# Patient Record
Sex: Female | Born: 1935 | Race: White | Hispanic: No | Smoking: Never smoker
Health system: Southern US, Community
[De-identification: ages and names within clinical notes are randomized; demographics above are authoritative.]

## PROBLEM LIST (undated history)

## (undated) DIAGNOSIS — I119 Hypertensive heart disease without heart failure: Secondary | ICD-10-CM

## (undated) DIAGNOSIS — C189 Malignant neoplasm of colon, unspecified: Secondary | ICD-10-CM

## (undated) DIAGNOSIS — I480 Paroxysmal atrial fibrillation: Secondary | ICD-10-CM

## (undated) DIAGNOSIS — I4892 Unspecified atrial flutter: Secondary | ICD-10-CM

## (undated) DIAGNOSIS — I2510 Atherosclerotic heart disease of native coronary artery without angina pectoris: Secondary | ICD-10-CM

## (undated) DIAGNOSIS — R001 Bradycardia, unspecified: Secondary | ICD-10-CM

## (undated) DIAGNOSIS — E119 Type 2 diabetes mellitus without complications: Secondary | ICD-10-CM

## (undated) DIAGNOSIS — I5032 Chronic diastolic (congestive) heart failure: Secondary | ICD-10-CM

## (undated) DIAGNOSIS — Z8719 Personal history of other diseases of the digestive system: Secondary | ICD-10-CM

## (undated) HISTORY — DX: Unspecified atrial flutter: I48.92

## (undated) HISTORY — PX: TONSILLECTOMY: SUR1361

## (undated) HISTORY — PX: CORONARY ANGIOPLASTY WITH STENT PLACEMENT: SHX49

## (undated) HISTORY — DX: Personal history of other diseases of the digestive system: Z87.19

## (undated) HISTORY — PX: ABDOMINAL HYSTERECTOMY: SHX81

## (undated) HISTORY — DX: Hypertensive heart disease without heart failure: I11.9

---

## 2000-10-23 ENCOUNTER — Emergency Department (HOSPITAL_COMMUNITY): Admission: EM | Admit: 2000-10-23 | Discharge: 2000-10-23 | Payer: Self-pay | Admitting: Emergency Medicine

## 2004-12-07 ENCOUNTER — Inpatient Hospital Stay (HOSPITAL_COMMUNITY): Admission: RE | Admit: 2004-12-07 | Discharge: 2004-12-13 | Payer: Self-pay | Admitting: Gastroenterology

## 2004-12-07 ENCOUNTER — Ambulatory Visit: Payer: Self-pay | Admitting: Oncology

## 2004-12-19 ENCOUNTER — Inpatient Hospital Stay (HOSPITAL_COMMUNITY): Admission: EM | Admit: 2004-12-19 | Discharge: 2004-12-22 | Payer: Self-pay | Admitting: Emergency Medicine

## 2011-06-28 ENCOUNTER — Other Ambulatory Visit: Payer: Self-pay | Admitting: Dermatology

## 2014-01-13 ENCOUNTER — Emergency Department (HOSPITAL_COMMUNITY): Payer: Medicare Other

## 2014-01-13 ENCOUNTER — Encounter (HOSPITAL_COMMUNITY): Payer: Self-pay | Admitting: Family Medicine

## 2014-01-13 ENCOUNTER — Inpatient Hospital Stay (HOSPITAL_COMMUNITY)
Admission: EM | Admit: 2014-01-13 | Discharge: 2014-01-16 | DRG: 246 | Disposition: A | Discharge status: Home / Self Care | Payer: Medicare Other | Attending: Cardiovascular Disease | Admitting: Cardiovascular Disease

## 2014-01-13 DIAGNOSIS — I214 Non-ST elevation (NSTEMI) myocardial infarction: Secondary | ICD-10-CM | POA: Diagnosis not present

## 2014-01-13 DIAGNOSIS — Z85038 Personal history of other malignant neoplasm of large intestine: Secondary | ICD-10-CM

## 2014-01-13 DIAGNOSIS — E876 Hypokalemia: Secondary | ICD-10-CM | POA: Diagnosis not present

## 2014-01-13 DIAGNOSIS — I5043 Acute on chronic combined systolic (congestive) and diastolic (congestive) heart failure: Secondary | ICD-10-CM | POA: Diagnosis present

## 2014-01-13 DIAGNOSIS — Z886 Allergy status to analgesic agent status: Secondary | ICD-10-CM

## 2014-01-13 DIAGNOSIS — Z8679 Personal history of other diseases of the circulatory system: Secondary | ICD-10-CM

## 2014-01-13 DIAGNOSIS — I472 Ventricular tachycardia: Secondary | ICD-10-CM | POA: Diagnosis present

## 2014-01-13 DIAGNOSIS — R1010 Upper abdominal pain, unspecified: Secondary | ICD-10-CM

## 2014-01-13 DIAGNOSIS — I10 Essential (primary) hypertension: Secondary | ICD-10-CM | POA: Diagnosis present

## 2014-01-13 DIAGNOSIS — I480 Paroxysmal atrial fibrillation: Secondary | ICD-10-CM

## 2014-01-13 DIAGNOSIS — R001 Bradycardia, unspecified: Secondary | ICD-10-CM | POA: Diagnosis present

## 2014-01-13 DIAGNOSIS — J90 Pleural effusion, not elsewhere classified: Secondary | ICD-10-CM

## 2014-01-13 DIAGNOSIS — Z955 Presence of coronary angioplasty implant and graft: Secondary | ICD-10-CM

## 2014-01-13 DIAGNOSIS — E119 Type 2 diabetes mellitus without complications: Secondary | ICD-10-CM

## 2014-01-13 HISTORY — DX: Type 2 diabetes mellitus without complications: E11.9

## 2014-01-13 LAB — PROTIME-INR
INR: 1.16 (ref 0.00–1.49)
Prothrombin Time: 14.9 seconds (ref 11.6–15.2)

## 2014-01-13 LAB — CBC WITH DIFFERENTIAL/PLATELET
Basophils Absolute: 0 10*3/uL (ref 0.0–0.1)
Basophils Relative: 0 % (ref 0–1)
EOS ABS: 0 10*3/uL (ref 0.0–0.7)
Eosinophils Relative: 0 % (ref 0–5)
HCT: 41.9 % (ref 36.0–46.0)
HEMOGLOBIN: 14.1 g/dL (ref 12.0–15.0)
LYMPHS ABS: 1.1 10*3/uL (ref 0.7–4.0)
Lymphocytes Relative: 11 % — ABNORMAL LOW (ref 12–46)
MCH: 29.4 pg (ref 26.0–34.0)
MCHC: 33.7 g/dL (ref 30.0–36.0)
MCV: 87.3 fL (ref 78.0–100.0)
MONOS PCT: 6 % (ref 3–12)
Monocytes Absolute: 0.6 10*3/uL (ref 0.1–1.0)
NEUTROS PCT: 83 % — AB (ref 43–77)
Neutro Abs: 7.9 10*3/uL — ABNORMAL HIGH (ref 1.7–7.7)
PLATELETS: 216 10*3/uL (ref 150–400)
RBC: 4.8 MIL/uL (ref 3.87–5.11)
RDW: 13.4 % (ref 11.5–15.5)
WBC: 9.6 10*3/uL (ref 4.0–10.5)

## 2014-01-13 LAB — COMPREHENSIVE METABOLIC PANEL
ALK PHOS: 78 U/L (ref 39–117)
ALT: 19 U/L (ref 0–35)
ANION GAP: 16 — AB (ref 5–15)
AST: 43 U/L — ABNORMAL HIGH (ref 0–37)
Albumin: 3.9 g/dL (ref 3.5–5.2)
BILIRUBIN TOTAL: 1.1 mg/dL (ref 0.3–1.2)
BUN: 13 mg/dL (ref 6–23)
CO2: 24 meq/L (ref 19–32)
Calcium: 9.2 mg/dL (ref 8.4–10.5)
Chloride: 98 mEq/L (ref 96–112)
Creatinine, Ser: 0.65 mg/dL (ref 0.50–1.10)
GFR, EST AFRICAN AMERICAN: 89 mL/min — AB (ref >90)
GFR, EST NON AFRICAN AMERICAN: 77 mL/min — AB (ref >90)
Glucose, Bld: 233 mg/dL — ABNORMAL HIGH (ref 70–99)
POTASSIUM: 4 meq/L (ref 3.7–5.3)
SODIUM: 138 meq/L (ref 137–147)
Total Protein: 7.8 g/dL (ref 6.0–8.3)

## 2014-01-13 LAB — I-STAT TROPONIN, ED: Troponin i, poc: 4.4 ng/mL (ref 0.00–0.08)

## 2014-01-13 LAB — TROPONIN I
TROPONIN I: 6.2 ng/mL — AB (ref <0.30)
Troponin I: >20 ng/mL (ref <0.30)

## 2014-01-13 LAB — GLUCOSE, CAPILLARY
GLUCOSE-CAPILLARY: 194 mg/dL — AB (ref 70–99)
Glucose-Capillary: 210 mg/dL — ABNORMAL HIGH (ref 70–99)

## 2014-01-13 LAB — TSH: TSH: 4.19 u[IU]/mL (ref 0.350–4.500)

## 2014-01-13 LAB — HEPARIN LEVEL (UNFRACTIONATED): Heparin Unfractionated: 0.46 IU/mL (ref 0.30–0.70)

## 2014-01-13 LAB — PRO B NATRIURETIC PEPTIDE: PRO B NATRI PEPTIDE: 2416 pg/mL — AB (ref 0–450)

## 2014-01-13 LAB — APTT: aPTT: 129 seconds — ABNORMAL HIGH (ref 24–37)

## 2014-01-13 LAB — LIPASE, BLOOD: Lipase: 15 U/L (ref 11–59)

## 2014-01-13 MED ORDER — ASPIRIN EC 81 MG PO TBEC: 81.0000 mg | DELAYED_RELEASE_TABLET | Freq: Every day | ORAL | Status: DC

## 2014-01-13 MED ORDER — CLOPIDOGREL BISULFATE 75 MG PO TABS
75.0000 mg | ORAL_TABLET | Freq: Every day | ORAL | Status: DC
  Administered 2014-01-14 – 2014-01-16 (×3): 75 mg via ORAL
  Filled 2014-01-13 (×5): qty 1

## 2014-01-13 MED ORDER — HEPARIN BOLUS VIA INFUSION
4000.0000 [IU] | Freq: Once | INTRAVENOUS | Status: AC
  Administered 2014-01-13: 4000 [IU] via INTRAVENOUS
  Filled 2014-01-13: qty 4000

## 2014-01-13 MED ORDER — IRBESARTAN 300 MG PO TABS
300.0000 mg | ORAL_TABLET | Freq: Every day | ORAL | Status: DC
  Administered 2014-01-13 – 2014-01-16 (×4): 300 mg via ORAL
  Filled 2014-01-13 (×4): qty 1

## 2014-01-13 MED ORDER — SODIUM CHLORIDE 0.9 % IJ SOLN: 3.0000 mL | INTRAMUSCULAR | Status: DC | PRN

## 2014-01-13 MED ORDER — ACETAMINOPHEN 325 MG PO TABS: 650.0000 mg | ORAL_TABLET | ORAL | Status: DC | PRN

## 2014-01-13 MED ORDER — SODIUM CHLORIDE 0.9 % IJ SOLN
3.0000 mL | Freq: Two times a day (BID) | INTRAMUSCULAR | Status: DC
  Administered 2014-01-13: 3 mL via INTRAVENOUS

## 2014-01-13 MED ORDER — SODIUM CHLORIDE 0.9 % IJ SOLN
3.0000 mL | Freq: Two times a day (BID) | INTRAMUSCULAR | Status: DC
  Administered 2014-01-14 – 2014-01-15 (×3): 3 mL via INTRAVENOUS

## 2014-01-13 MED ORDER — SODIUM CHLORIDE 0.9 % IV SOLN: 250.0000 mL | INTRAVENOUS | Status: DC | PRN

## 2014-01-13 MED ORDER — DILTIAZEM HCL ER COATED BEADS 180 MG PO CP24
180.0000 mg | ORAL_CAPSULE | Freq: Every day | ORAL | Status: DC
  Administered 2014-01-13 – 2014-01-16 (×4): 180 mg via ORAL
  Filled 2014-01-13 (×4): qty 1

## 2014-01-13 MED ORDER — ATORVASTATIN CALCIUM 80 MG PO TABS
80.0000 mg | ORAL_TABLET | Freq: Every day | ORAL | Status: DC
  Administered 2014-01-14 – 2014-01-15 (×2): 80 mg via ORAL
  Filled 2014-01-13 (×5): qty 1

## 2014-01-13 MED ORDER — HEPARIN (PORCINE) IN NACL 100-0.45 UNIT/ML-% IJ SOLN
800.0000 [IU]/h | INTRAMUSCULAR | Status: DC
  Administered 2014-01-13: 800 [IU]/h via INTRAVENOUS
  Filled 2014-01-13 (×2): qty 250

## 2014-01-13 MED ORDER — ONDANSETRON HCL 4 MG/2ML IJ SOLN: 4.0000 mg | Freq: Four times a day (QID) | INTRAMUSCULAR | Status: DC | PRN

## 2014-01-13 MED ORDER — CITALOPRAM HYDROBROMIDE 20 MG PO TABS
20.0000 mg | ORAL_TABLET | Freq: Every day | ORAL | Status: DC
  Administered 2014-01-13 – 2014-01-16 (×4): 20 mg via ORAL
  Filled 2014-01-13 (×4): qty 1

## 2014-01-13 MED ORDER — METOPROLOL TARTRATE 12.5 MG HALF TABLET
12.5000 mg | ORAL_TABLET | Freq: Two times a day (BID) | ORAL | Status: DC
  Administered 2014-01-13 – 2014-01-16 (×5): 12.5 mg via ORAL
  Filled 2014-01-13 (×8): qty 1

## 2014-01-13 MED ORDER — INSULIN ASPART 100 UNIT/ML ~~LOC~~ SOLN
0.0000 [IU] | Freq: Three times a day (TID) | SUBCUTANEOUS | Status: DC
  Administered 2014-01-13: 3 [IU] via SUBCUTANEOUS
  Administered 2014-01-15: 08:00:00 2 [IU] via SUBCUTANEOUS
  Administered 2014-01-15: 12:00:00 5 [IU] via SUBCUTANEOUS
  Administered 2014-01-15 – 2014-01-16 (×2): 3 [IU] via SUBCUTANEOUS

## 2014-01-13 MED ORDER — NITROGLYCERIN 0.4 MG SL SUBL: 0.4000 mg | SUBLINGUAL_TABLET | SUBLINGUAL | Status: DC | PRN

## 2014-01-13 MED ORDER — ASPIRIN 81 MG PO CHEW: 324.0000 mg | CHEWABLE_TABLET | Freq: Once | ORAL | Status: DC

## 2014-01-13 MED ORDER — SODIUM CHLORIDE 0.9 % IV SOLN
INTRAVENOUS | Status: DC
  Administered 2014-01-13: 75 mL/h via INTRAVENOUS
  Administered 2014-01-14 (×2): via INTRAVENOUS

## 2014-01-13 NOTE — ED Notes (Signed)
Per pt and family having upper abdominal pain , weakness and nausea. sts also some neck pain.

## 2014-01-13 NOTE — ED Notes (Signed)
NOTIFIED DR. Venora Maples IN PERSON FOR PATIENTS PANIC LAB RESULTS OF I-STAT TROPONIN = 4.40 ng/ml @11 :05 AM ,01/13/2014.

## 2014-01-13 NOTE — ED Provider Notes (Signed)
CSN: [id number]     Arrival date & time 01/13/14  8341 History   First MD Initiated Contact with Patient 01/13/14 1118     Chief Complaint  Patient presents with  . Abdominal Pain    Patient is a 80 y.o. female presenting with abdominal pain. The history is provided by the patient.  Abdominal Pain Pain location:  Epigastric Pain quality comment:  "discomfort" Pain radiates to:  LUQ and RUQ Pain severity:  Mild Onset quality:  Gradual Duration:  2 days Timing:  Intermittent Progression:  Waxing and waning Chronicity:  New Context: not diet changes, not sick contacts and not suspicious food intake   Relieved by:  None tried Worsened by:  Nothing tried Ineffective treatments:  None tried Associated symptoms: nausea, shortness of breath and vomiting   Associated symptoms: no cough and no fever   Nausea:    Severity:  Severe   Onset quality:  Gradual   Duration:  1 day   Timing:  Constant   Progression:  Improving Shortness of breath:    Severity:  Mild   Onset quality:  Gradual   Progression:  Unchanged Vomiting:    Quality:  Stomach contents   Progression:  Partially resolved Risk factors: being elderly     Past Medical History  Diagnosis Date  . Diabetes mellitus without complication   . Hypertension   . Atrial fibrillation    History reviewed. No pertinent past surgical history. History reviewed. No pertinent family history. History  Substance Use Topics  . Smoking status: Never Smoker   . Smokeless tobacco: Not on file  . Alcohol Use: No   OB History    No data available     Review of Systems  Constitutional: Negative for fever.  Respiratory: Positive for shortness of breath. Negative for cough.   Gastrointestinal: Positive for nausea, vomiting and abdominal pain.  Skin: Negative for rash.  Neurological: Negative for syncope and light-headedness.  All other systems reviewed and are negative.   Allergies  Review of patient's allergies indicates not  on file.  Home Medications   Prior to Admission medications   Not on File   BP 159/77 mmHg  Pulse 102  Temp(Src) 97.9 F (36.6 C) (Oral)  Resp 18  SpO2 91%   Physical Exam  Constitutional: She is oriented to person, place, and time. She appears well-developed and well-nourished. She is cooperative. No distress.  HENT:  Head: Normocephalic and atraumatic.  Right Ear: External ear normal.  Left Ear: External ear normal.  Neck: Normal range of motion and phonation normal.  Cardiovascular: Normal rate and regular rhythm.   Pulmonary/Chest: Effort normal. No respiratory distress. She has no wheezes. She has rales (bilateral bases).  Abdominal: Soft. She exhibits no distension. There is no tenderness. There is no rebound and no guarding.  Neurological: She is alert and oriented to person, place, and time.  Skin: Skin is warm and dry. No rash noted. She is not diaphoretic.  Vitals reviewed.   ED Course  Procedures   Labs Review  Results for orders placed or performed during the hospital encounter of 01/13/14  CBC with Differential  Result Value Ref Range   WBC 9.6 4.0 - 10.5 K/uL   RBC 4.80 3.87 - 5.11 MIL/uL   Hemoglobin 14.1 12.0 - 15.0 g/dL   HCT 41.9 36.0 - 46.0 %   MCV 87.3 78.0 - 100.0 fL   MCH 29.4 26.0 - 34.0 pg   MCHC 33.7 30.0 - 36.0  g/dL   RDW 13.4 11.5 - 15.5 %   Platelets 216 150 - 400 K/uL   Neutrophils Relative % 83 (H) 43 - 77 %   Neutro Abs 7.9 (H) 1.7 - 7.7 K/uL   Lymphocytes Relative 11 (L) 12 - 46 %   Lymphs Abs 1.1 0.7 - 4.0 K/uL   Monocytes Relative 6 3 - 12 %   Monocytes Absolute 0.6 0.1 - 1.0 K/uL   Eosinophils Relative 0 0 - 5 %   Eosinophils Absolute 0.0 0.0 - 0.7 K/uL   Basophils Relative 0 0 - 1 %   Basophils Absolute 0.0 0.0 - 0.1 K/uL  Comprehensive metabolic panel  Result Value Ref Range   Sodium 138 137 - 147 mEq/L   Potassium 4.0 3.7 - 5.3 mEq/L   Chloride 98 96 - 112 mEq/L   CO2 24 19 - 32 mEq/L   Glucose, Bld 233 (H) 70 - 99  mg/dL   BUN 13 6 - 23 mg/dL   Creatinine, Ser 0.65 0.50 - 1.10 mg/dL   Calcium 9.2 8.4 - 10.5 mg/dL   Total Protein 7.8 6.0 - 8.3 g/dL   Albumin 3.9 3.5 - 5.2 g/dL   AST 43 (H) 0 - 37 U/L   ALT 19 0 - 35 U/L   Alkaline Phosphatase 78 39 - 117 U/L   Total Bilirubin 1.1 0.3 - 1.2 mg/dL   GFR calc non Af Amer 77 (L) >90 mL/min   GFR calc Af Amer 89 (L) >90 mL/min   Anion gap 16 (H) 5 - 15  Lipase, blood  Result Value Ref Range   Lipase 15 11 - 59 U/L  Troponin I  Result Value Ref Range   Troponin I 6.20 (HH) <0.30 ng/mL  Pro b natriuretic peptide  Result Value Ref Range   Pro B Natriuretic peptide (BNP) 2416.0 (H) 0 - 450 pg/mL  I-stat troponin, ED (only if pt is 78 y.o. or older & pain is above umbilicus) - do not order at [facility]  Result Value Ref Range   Troponin i, poc 4.40 (HH) 0.00 - 0.08 ng/mL   Comment NOTIFIED PHYSICIAN    Comment 3           Imaging Review Dg Chest Portable 1 View  01/13/2014   CLINICAL DATA:  Epigastric pain and nausea  EXAM: PORTABLE CHEST - 1 VIEW  COMPARISON:  None  FINDINGS: Moderate cardiomegaly. Vascular congestion. Bilateral pleural effusions and bibasilar airspace edema. No pneumothorax.  IMPRESSION: CHF with bilateral effusions and pulmonary edema.   Electronically Signed   By: Maryclare Bean M.D.   On: 01/13/2014 12:21   EKG Rate 96, Sinus rhythm, incomplete RBBB, PR 178, QTc 487; No ST elevation or depression; No priors for comparison  MDM   Final diagnoses:  Upper abdominal pain  NSTEMI (non-ST elevated myocardial infarction)    80 y.o. female presents for upper abdominal "discomfort" and nausea since yesterday. Abdomen completely benign. iSTAT troponin elevated to 4.4. EKG showed no STEMI. Rales in the bases, concern for CHF.   Concern for NSTEMI.   She is allergic to aspirin, so a heparin drip was ordered.   No CP, SOB, lightheadedness.   Repeat troponin 6.2. BNP elevated.   Cardiology consulted. They saw and evaluated her. She was  admitted to cardiology.   This case managed in conjunction with my attending, Dr. Reather Converse.    Berenice Primas, MD 01/13/14 [phone]

## 2014-01-13 NOTE — Progress Notes (Signed)
ANTICOAGULATION CONSULT NOTE - Initial Consult  Pharmacy Consult for heparin Indication: chest pain/ACS  Allergies  Allergen Reactions  . Coumadin [Warfarin]     Extreme bleeding  . Shellfish Allergy     Very alert.  Unable to sleep  . Versed [Midazolam] Nausea And Vomiting  . Aspirin Rash  . Penicillins Rash  . Sulfa Antibiotics Rash    Patient Measurements: Height: 5\' 4"  (162.6 cm) Weight: 154 lb (69.854 kg) IBW/kg (Calculated) : 54.7 Heparin Dosing Weight: 68.9kg  Vital Signs: Temp: 97.9 F (36.6 C) (11/01 0952) Temp Source: Oral (11/01 0952) BP: 157/89 mmHg (11/01 1230) Pulse Rate: 99 (11/01 1230)  Labs:  Recent Labs  01/13/14 1030 01/13/14 1119  HGB 14.1  --   HCT 41.9  --   PLT 216  --   CREATININE 0.65  --   TROPONINI  --  6.20*    Estimated Creatinine Clearance: 46.7 mL/min (by C-G formula based on Cr of 0.65).   Medical History: Past Medical History  Diagnosis Date  . Diabetes mellitus without complication   . Hypertension   . Atrial fibrillation     Medications:  Infusions:  . [START ON 01/14/2014] sodium chloride    . heparin    . heparin      Assessment: 89 yof presented to the ED with epigastric pain and nausea since last night. Noted to have a significantly elevated troponin. To start heparin for anticoagulation. Baseline CBC is WNL and she is not on any anticoagulation PTA.   Goal of Therapy:  Heparin level 0.3-0.7 units/ml Monitor platelets by anticoagulation protocol: Yes   Plan:  1. Heparin bolus 4000 units IV x 1 2. Heparin gtt 800 units/hr 3. Check an 8 hour HL 4. Daily HL and CBC  Lilah Mijangos, Rande Lawman 01/13/2014,1:30 PM

## 2014-01-13 NOTE — ED Notes (Signed)
Pt states started having RUQ discomfort last nite, that may have radiated to her back.  No sob or chest pain and denies any chest discomfort or sob at this time.  Only history with her heart is afib

## 2014-01-13 NOTE — Progress Notes (Signed)
[place] for heparin Indication: chest pain/ACS  Allergies  Allergen Reactions  . Coumadin [Warfarin]     Extreme bleeding  . Shellfish Allergy     Very alert.  Unable to sleep  . Versed [Midazolam] Nausea And Vomiting  . Aspirin Rash  . Penicillins Rash  . Sulfa Antibiotics Rash    Patient Measurements: Height: 5\' 4"  (162.6 cm) Weight: 152 lb 6.4 oz (69.128 kg) IBW/kg (Calculated) : 54.7 Heparin Dosing Weight: 68.9kg  Vital Signs: Temp: 98 F (36.7 C) (11/01 2017) Temp Source: Oral (11/01 2017) BP: 129/59 mmHg (11/01 2017) Pulse Rate: 74 (11/01 2017)  Labs:  Recent Labs  01/13/14 1030 01/13/14 1119 01/13/14 1815 01/13/14 2200  HGB 14.1  --   --   --   HCT 41.9  --   --   --   PLT 216  --   --   --   APTT  --   --  129*  --   LABPROT  --   --  14.9  --   INR  --   --  1.16  --   HEPARINUNFRC  --   --   --  0.46  CREATININE 0.65  --   --   --   TROPONINI  --  6.20* >20.00*  --     Estimated Creatinine Clearance: 46.4 mL/min (by C-G formula based on Cr of 0.65).  Assessment: 80 yo female with NSTEMI for heparin  Goal of Therapy:  Heparin level 0.3-0.7 units/ml Monitor platelets by anticoagulation protocol: Yes   Plan:  Continue Heparin at current rate Follow-up am labs.  Demira Gwynne, Bronson Curb 01/13/2014,10:55 PM

## 2014-01-13 NOTE — ED Notes (Signed)
Cardiology at the bedside.

## 2014-01-13 NOTE — Plan of Care (Signed)
Problem: Phase I Progression Outcomes Goal: Procedure Consent complete Outcome: Completed/Met Date Met:  01/13/14 Goal: CV Lab checklist complete Outcome: Completed/Met Date Met:  01/13/14 Goal: Distal pulses assessed/charted Outcome: Completed/Met Date Met:  01/13/14 Goal: Pain controlled with appropriate interventions Outcome: Completed/Met Date Met:  01/13/14 Goal: OOB as tolerated unless otherwise ordered Outcome: Completed/Met Date Met:  01/13/14

## 2014-01-13 NOTE — Plan of Care (Signed)
Problem: Consults Goal: Skin Care Protocol Initiated - if Braden Score 18 or less If consults are not indicated, leave blank or document N/A Outcome: Not Applicable Date Met:  35/78/97 Goal: Tobacco Cessation referral if indicated Outcome: Not Applicable Date Met:  84/78/41 Goal: Nutrition Consult-if indicated Outcome: Not Applicable Date Met:  28/20/81 Goal: Diabetes Guidelines if Diabetic/Glucose > 140 If diabetic or lab glucose is > 140 mg/dl - Initiate Diabetes/Hyperglycemia Guidelines & Document Interventions  Outcome: Completed/Met Date Met:  01/13/14

## 2014-01-13 NOTE — H&P (Signed)
Patient ID: Anita Roberts MRN: [id number], DOB/AGE: 78-Sep-1925   Admit date: 01/13/2014   Primary Physician: No primary care provider on file. Primary Cardiologist: New  Pt. Profile:  Anita Roberts is a 80 y.o. female with a history of HTN, colon cancer, post op atrial fib s/p colon polyp removal and DM who presented to [facility] this morning complaining of epigastric pain and nausea since last night. She has no past cardiac history but her troponin was noted to be elevated at 4.4 and cardiology was consulted.  She was in her usual state of health until last night when she started to feel slightly nauseated. This morning it became worse prompting her to present to the ED. It comes and goes and is described as an epigastric discomfort. No CP or SOB. No lightheadedness or dizziness or palpitations. No orthopnea, PND or LE edema. She is pretty active at home and is able to carry on with her daily activities including light house work and grocery shopping without any CP or SOB. She does have associated nausea but no vomiting. Not related to eating and has not eaten anything this morning. She has no family history of heart disease. She is a never smoker    Problem List  Past Medical History  Diagnosis Date  . Diabetes mellitus without complication   . Hypertension   . Atrial fibrillation     History reviewed. No pertinent past surgical history.   Allergies  Allergies not on file   Home Medications  Prior to Admission medications   Not on File    Family History  History reviewed. No pertinent family history. No family status information on file.     Social History  History   Social History  . Marital Status: Widowed    Spouse Name: N/A    Number of Children: N/A  . Years of Education: N/A   Occupational History  . Not on file.   Social History Main Topics  . Smoking status: Never Smoker   . Smokeless tobacco: Not on file  . Alcohol Use: No  . Drug Use: Not on file   . Sexual Activity: Not on file   Other Topics Concern  . Not on file   Social History Narrative  . No narrative on file     All other systems reviewed and are otherwise negative except as noted above.  Physical Exam  Blood pressure 160/81, pulse 102, temperature 97.9 F (36.6 C), temperature source Oral, resp. rate 16, SpO2 98 %.  General: Pleasant, NAD. Elderly  Psych: Normal affect. Neuro: Alert and oriented X 3. Moves all extremities spontaneously. HEENT: Normal  Neck: Supple without bruits or JVD. Lungs:  Resp regular and unlabored, CTA. Heart: tachycardic no s3, s4, or murmurs. Abdomen: Soft, non-tender, non-distended, BS + x 4.  Extremities: No clubbing, cyanosis or edema. DP/PT/Radials 2+ and equal bilaterally.  Labs  No results for input(s): CKTOTAL, CKMB, TROPONINI in the last 72 hours. Lab Results  Component Value Date   WBC 9.6 01/13/2014   HGB 14.1 01/13/2014   HCT 41.9 01/13/2014   MCV 87.3 01/13/2014   PLT 216 01/13/2014    Recent Labs Lab 01/13/14 1030  NA 138  K 4.0  CL 98  CO2 24  BUN 13  CREATININE 0.65  CALCIUM 9.2  PROT 7.8  BILITOT 1.1  ALKPHOS 78  ALT 19  AST 43*  GLUCOSE 233*    Radiology/Studies  No results found.  ECG  None in system  ASSESSMENT AND PLAN  Anita Roberts is a 80 y.o. female with a history of HTN, colon cancer, post op atrial fib s/p colon polyp removal and DM who presented to [facility] this morning complaining of epigastric pain and nausea since last night. She has no past cardiac history but her troponin was noted to be elevated at 4.4 and cardiology was consulted.  Positive troponin- She has no CP or SOB; however, worrisome for CAD in the setting of an elderly woman with DM.  -- Troponin 4.4--> 6.2. -- BNP slightly elevated 2.4. Will order an ECHO to further evaluate LV function and WMAs. -- Continue heparin gtt. -- Allergy to ASA so will place on plavix, lopressor 12.5 mg BID and statin  -- Place on cath  board tomorrow. NPO after midnight  PAF- occurred once post operatively after colon cancer removal about 8 years ago.  -- This has been quiescent on diltiazem  -- Was on Coumadin for a short time but did have some bleeding and this was discontinued  -- Tele today with some irregular rhythm: sinus with freq PACs vs afib vs wandering atrial pacemaker.   HTN- continue home Benicar 40, diltiazem CD 180mg  and will add BB in the setting of possible CAD  DM- SSI   Signed, Eileen Stanford, PA-C 01/13/2014, 12:12 PM  Pager [phone]  I have seen and examined the patient along with THOMPSON, KATHRYN R, PA-C.  I have reviewed the chart, notes and new data.  I agree with PA's note.  Key new complaints: still with mild discomfort Key examination changes: ECG without major ST deviation Key new findings / data: repeat troponin 6.4  PLAN: Acute NSTEMI with delayed presentation (probably happened last night) with mild residual angina, but no acute HF. Frequent PACs, no ventricular arrhythmia. History of PAF remotely (2006, during surgical illness). Initially she wanted a more conservative approach, but after discussion with her daughter she woul;d prefer proceeding directly to coronary angio and possible PCI. I discussed the risks and benfits of these procedures in detail with her and she understands them. ASA allergic (rash) - clopidogrel, heparin, beta blockers At risk for recurrent PAF.  Sanda Klein, MD, [place] [phone] 01/13/2014, 12:52 PM

## 2014-01-13 NOTE — Progress Notes (Signed)
I received report from [facility].

## 2014-01-14 ENCOUNTER — Other Ambulatory Visit: Payer: Self-pay

## 2014-01-14 ENCOUNTER — Encounter (HOSPITAL_COMMUNITY)
Admission: EM | Disposition: A | Discharge status: Home / Self Care | Payer: Medicare Other | Source: Home / Self Care | Attending: Cardiovascular Disease

## 2014-01-14 DIAGNOSIS — E876 Hypokalemia: Secondary | ICD-10-CM | POA: Diagnosis not present

## 2014-01-14 DIAGNOSIS — I059 Rheumatic mitral valve disease, unspecified: Secondary | ICD-10-CM

## 2014-01-14 DIAGNOSIS — R001 Bradycardia, unspecified: Secondary | ICD-10-CM | POA: Diagnosis present

## 2014-01-14 DIAGNOSIS — E119 Type 2 diabetes mellitus without complications: Secondary | ICD-10-CM | POA: Diagnosis present

## 2014-01-14 DIAGNOSIS — I2510 Atherosclerotic heart disease of native coronary artery without angina pectoris: Secondary | ICD-10-CM

## 2014-01-14 DIAGNOSIS — I214 Non-ST elevation (NSTEMI) myocardial infarction: Secondary | ICD-10-CM | POA: Diagnosis present

## 2014-01-14 DIAGNOSIS — Z886 Allergy status to analgesic agent status: Secondary | ICD-10-CM | POA: Diagnosis not present

## 2014-01-14 DIAGNOSIS — I5043 Acute on chronic combined systolic (congestive) and diastolic (congestive) heart failure: Secondary | ICD-10-CM | POA: Diagnosis present

## 2014-01-14 DIAGNOSIS — I472 Ventricular tachycardia: Secondary | ICD-10-CM | POA: Diagnosis present

## 2014-01-14 DIAGNOSIS — I10 Essential (primary) hypertension: Secondary | ICD-10-CM | POA: Diagnosis present

## 2014-01-14 DIAGNOSIS — I480 Paroxysmal atrial fibrillation: Secondary | ICD-10-CM | POA: Diagnosis present

## 2014-01-14 DIAGNOSIS — Z85038 Personal history of other malignant neoplasm of large intestine: Secondary | ICD-10-CM | POA: Diagnosis not present

## 2014-01-14 HISTORY — PX: LEFT HEART CATHETERIZATION WITH CORONARY ANGIOGRAM: SHX5451

## 2014-01-14 LAB — COMPREHENSIVE METABOLIC PANEL
ALBUMIN: 2.9 g/dL — AB (ref 3.5–5.2)
ALK PHOS: 59 U/L (ref 39–117)
ALT: 18 U/L (ref 0–35)
AST: 58 U/L — ABNORMAL HIGH (ref 0–37)
Anion gap: 12 (ref 5–15)
BUN: 12 mg/dL (ref 6–23)
CALCIUM: 8.2 mg/dL — AB (ref 8.4–10.5)
CO2: 24 mEq/L (ref 19–32)
Chloride: 100 mEq/L (ref 96–112)
Creatinine, Ser: 0.71 mg/dL (ref 0.50–1.10)
GFR calc non Af Amer: 75 mL/min — ABNORMAL LOW (ref >90)
GFR, EST AFRICAN AMERICAN: 87 mL/min — AB (ref >90)
GLUCOSE: 149 mg/dL — AB (ref 70–99)
POTASSIUM: 3.7 meq/L (ref 3.7–5.3)
SODIUM: 136 meq/L — AB (ref 137–147)
TOTAL PROTEIN: 6.1 g/dL (ref 6.0–8.3)
Total Bilirubin: 0.8 mg/dL (ref 0.3–1.2)

## 2014-01-14 LAB — URINALYSIS, ROUTINE W REFLEX MICROSCOPIC
GLUCOSE, UA: 100 mg/dL — AB
Ketones, ur: 15 mg/dL — AB
Nitrite: NEGATIVE
PH: 6 (ref 5.0–8.0)
Protein, ur: 30 mg/dL — AB
SPECIFIC GRAVITY, URINE: 1.021 (ref 1.005–1.030)
Urobilinogen, UA: 1 mg/dL (ref 0.0–1.0)

## 2014-01-14 LAB — HEMOGLOBIN A1C
HEMOGLOBIN A1C: 8 % — AB (ref <5.7)
MEAN PLASMA GLUCOSE: 183 mg/dL — AB (ref <117)

## 2014-01-14 LAB — URINE MICROSCOPIC-ADD ON

## 2014-01-14 LAB — CBC
HEMATOCRIT: 35.6 % — AB (ref 36.0–46.0)
Hemoglobin: 11.9 g/dL — ABNORMAL LOW (ref 12.0–15.0)
MCH: 29.2 pg (ref 26.0–34.0)
MCHC: 33.4 g/dL (ref 30.0–36.0)
MCV: 87.3 fL (ref 78.0–100.0)
Platelets: 198 10*3/uL (ref 150–400)
RBC: 4.08 MIL/uL (ref 3.87–5.11)
RDW: 13.6 % (ref 11.5–15.5)
WBC: 8.5 10*3/uL (ref 4.0–10.5)

## 2014-01-14 LAB — HEPARIN LEVEL (UNFRACTIONATED)
HEPARIN UNFRACTIONATED: 0.46 [IU]/mL (ref 0.30–0.70)
Heparin Unfractionated: 0.34 IU/mL (ref 0.30–0.70)

## 2014-01-14 LAB — GLUCOSE, CAPILLARY
GLUCOSE-CAPILLARY: 161 mg/dL — AB (ref 70–99)
Glucose-Capillary: 152 mg/dL — ABNORMAL HIGH (ref 70–99)
Glucose-Capillary: 139 mg/dL — ABNORMAL HIGH (ref 70–99)
Glucose-Capillary: 211 mg/dL — ABNORMAL HIGH (ref 70–99)

## 2014-01-14 LAB — POCT ACTIVATED CLOTTING TIME: Activated Clotting Time: 692 seconds

## 2014-01-14 LAB — PROTIME-INR
INR: 1.22 (ref 0.00–1.49)
Prothrombin Time: 15.5 seconds — ABNORMAL HIGH (ref 11.6–15.2)

## 2014-01-14 LAB — TROPONIN I
TROPONIN I: 7.67 ng/mL — AB (ref <0.30)
Troponin I: 14.96 ng/mL (ref <0.30)

## 2014-01-14 LAB — LIPID PANEL
CHOLESTEROL: 147 mg/dL (ref 0–200)
HDL: 53 mg/dL (ref >39)
LDL Cholesterol: 81 mg/dL (ref 0–99)
Total CHOL/HDL Ratio: 2.8 RATIO
Triglycerides: 65 mg/dL (ref <150)
VLDL: 13 mg/dL (ref 0–40)

## 2014-01-14 SURGERY — LEFT HEART CATHETERIZATION WITH CORONARY ANGIOGRAM
Anesthesia: LOCAL

## 2014-01-14 MED ORDER — LIDOCAINE HCL (PF) 1 % IJ SOLN
INTRAMUSCULAR | Status: AC
  Filled 2014-01-14: qty 30

## 2014-01-14 MED ORDER — BIVALIRUDIN 250 MG IV SOLR
INTRAVENOUS | Status: AC
Start: 2014-01-14 — End: 2014-01-14
  Filled 2014-01-14: qty 250

## 2014-01-14 MED ORDER — HEPARIN SODIUM (PORCINE) 1000 UNIT/ML IJ SOLN
INTRAMUSCULAR | Status: AC
  Filled 2014-01-14: qty 1

## 2014-01-14 MED ORDER — VERAPAMIL HCL 2.5 MG/ML IV SOLN
INTRAVENOUS | Status: AC
  Filled 2014-01-14: qty 2

## 2014-01-14 MED ORDER — NITROGLYCERIN 1 MG/10 ML FOR IR/CATH LAB
INTRA_ARTERIAL | Status: AC
  Filled 2014-01-14: qty 10

## 2014-01-14 MED ORDER — SODIUM CHLORIDE 0.9 % IV SOLN
INTRAVENOUS | Status: AC
  Administered 2014-01-14: 18:00:00 via INTRAVENOUS

## 2014-01-14 MED ORDER — FUROSEMIDE 10 MG/ML IJ SOLN
20.0000 mg | Freq: Once | INTRAMUSCULAR | Status: AC
  Administered 2014-01-14: 20 mg via INTRAVENOUS
  Filled 2014-01-14: qty 2

## 2014-01-14 MED ORDER — CLOPIDOGREL BISULFATE 300 MG PO TABS
ORAL_TABLET | ORAL | Status: AC
  Filled 2014-01-14: qty 1

## 2014-01-14 MED ORDER — HEPARIN (PORCINE) IN NACL 2-0.9 UNIT/ML-% IJ SOLN
INTRAMUSCULAR | Status: AC
  Filled 2014-01-14: qty 1500

## 2014-01-14 NOTE — Progress Notes (Signed)
ANTICOAGULATION CONSULT NOTE - Follow Up Consult  Pharmacy Consult for heparin Indication: NSTEMI  Allergies  Allergen Reactions  . Coumadin [Warfarin]     Extreme bleeding  . Shellfish Allergy     Very alert.  Unable to sleep  . Versed [Midazolam] Nausea And Vomiting  . Aspirin Rash  . Penicillins Rash  . Sulfa Antibiotics Rash    Patient Measurements: Height: 5\' 4"  (162.6 cm) Weight: 154 lb (69.854 kg) IBW/kg (Calculated) : 54.7 Heparin Dosing Weight: 69 kg  Vital Signs: Temp: 98.3 F (36.8 C) (11/02 0900) Temp Source: Oral (11/02 0900) BP: 127/65 mmHg (11/02 0900) Pulse Rate: 73 (11/02 1042)  Labs:  Recent Labs  01/13/14 1030  01/13/14 1815 01/13/14 2200 01/13/14 2355 01/14/14 0619  HGB 14.1  --   --   --   --  11.9*  HCT 41.9  --   --   --   --  35.6*  PLT 216  --   --   --   --  198  APTT  --   --  129*  --   --   --   LABPROT  --   --  14.9  --   --  15.5*  INR  --   --  1.16  --   --  1.22  HEPARINUNFRC  --   --   --  0.46 0.46 0.34  CREATININE 0.65  --   --   --   --  0.71  TROPONINI  --   < > >20.00*  --  14.96* 7.67*  < > = values in this interval not displayed.  Estimated Creatinine Clearance: 46.7 mL/min (by C-G formula based on Cr of 0.71).   Medications:  Infusions:  . sodium chloride 50 mL/hr at 01/14/14 0918  . heparin 800 Units/hr (01/13/14 1337)    Assessment: 80 y/o female on a heparin drip for NSTEMI awaiting cath this afternoon. Heparin level is therapeutic at 0.34 on 800 units/hr. No bleeding noted, Hb 11.9, platelets are stable.  Goal of Therapy:  Heparin level 0.3-0.7 units/ml Monitor platelets by anticoagulation protocol: Yes   Plan:  - Continue heparin drip at 800 units/hr - Daily heparin level and CBC - F/u after cath  [facility], Pharm.D., BCPS Clinical Pharmacist Pager: [phone] 01/14/2014 11:32 AM

## 2014-01-14 NOTE — Progress Notes (Signed)
  Echocardiogram 2D Echocardiogram has been performed.  Anita Roberts 01/14/2014, 12:29 PM

## 2014-01-14 NOTE — Interval H&P Note (Signed)
History and Physical Interval Note:  01/14/2014 2:50 PM  Anita Roberts  has presented today for cardiac cath with the diagnosis of NSTEMI.  The various methods of treatment have been discussed with the patient and family. After consideration of risks, benefits and other options for treatment, the patient has consented to  Procedure(s): LEFT HEART CATHETERIZATION WITH CORONARY ANGIOGRAM (N/A) as a surgical intervention .  The patient's history has been reviewed, patient examined, no change in status, stable for surgery.  I have reviewed the patient's chart and labs.  Questions were answered to the patient's satisfaction.    Cath Lab Visit (complete for each Cath Lab visit)  Clinical Evaluation Leading to the Procedure:   ACS: Yes.    Non-ACS:    Anginal Classification: CCS IV  Anti-ischemic medical therapy: Maximal Therapy (2 or more classes of medications)  Non-Invasive Test Results: No non-invasive testing performed  Prior CABG: No previous CABG         Anita Roberts

## 2014-01-14 NOTE — Progress Notes (Signed)
TR BAND REMOVAL  LOCATION:    right radial  DEFLATED PER PROTOCOL:    Yes.    TIME BAND OFF / DRESSING APPLIED:    1900   SITE UPON ARRIVAL:    Level 0  SITE AFTER BAND REMOVAL:    Level 0  REVERSE ALLEN'S TEST:     CIRCULATION SENSATION AND MOVEMENT:    Within Normal Limits   Yes.    COMMENTS:

## 2014-01-14 NOTE — Progress Notes (Signed)
Patient Name: Anita Roberts Date of Encounter: 01/14/2014     Active Problems:   NSTEMI (non-ST elevated myocardial infarction)    SUBJECTIVE  Feeling better. No CP or SOB   CURRENT MEDS . atorvastatin  80 mg Oral q1800  . citalopram  20 mg Oral Daily  . clopidogrel  75 mg Oral Q breakfast  . diltiazem  180 mg Oral Daily  . insulin aspart  0-15 Units Subcutaneous TID WC  . irbesartan  300 mg Oral Daily  . metoprolol tartrate  12.5 mg Oral BID  . sodium chloride  3 mL Intravenous Q12H  . sodium chloride  3 mL Intravenous Q12H    OBJECTIVE  Filed Vitals:   01/13/14 1600 01/13/14 1706 01/13/14 2017 01/14/14 0517  BP: 140/71 147/74 129/59 126/51  Pulse: 92 89 74 69  Temp:  98 F (36.7 C) 98 F (36.7 C) 97.9 F (36.6 C)  TempSrc:  Oral Oral Oral  Resp: 15 16 18 16   Height:      Weight:  152 lb 6.4 oz (69.128 kg)  154 lb (69.854 kg)  SpO2: 98% 98% 97%     Intake/Output Summary (Last 24 hours) at 01/14/14 0635 Last data filed at 01/13/14 1621  Gross per 24 hour  Intake    120 ml  Output    500 ml  Net   -380 ml   Filed Weights   01/13/14 1308 01/13/14 1706 01/14/14 0517  Weight: 154 lb (69.854 kg) 152 lb 6.4 oz (69.128 kg) 154 lb (69.854 kg)    PHYSICAL EXAM  General: Pleasant, NAD. Neuro: Alert and oriented X 3. Moves all extremities spontaneously. Psych: Normal affect. HEENT:  Normal  Neck: Supple without bruits or JVD. Lungs:  Resp regular and unlabored, CTA. Heart: RRR no s3, s4, or murmurs. Abdomen: Soft, non-tender, non-distended, BS + x 4.  Extremities: No clubbing, cyanosis or edema. DP/PT/Radials 2+ and equal bilaterally.  Accessory Clinical Findings  CBC  Recent Labs  01/13/14 1030  WBC 9.6  NEUTROABS 7.9*  HGB 14.1  HCT 41.9  MCV 87.3  PLT 353   Basic Metabolic Panel  Recent Labs  01/13/14 1030  NA 138  K 4.0  CL 98  CO2 24  GLUCOSE 233*  BUN 13  CREATININE 0.65  CALCIUM 9.2   Liver Function Tests  Recent Labs  01/13/14 1030  AST 43*  ALT 19  ALKPHOS 78  BILITOT 1.1  PROT 7.8  ALBUMIN 3.9    Recent Labs  01/13/14 1030  LIPASE 15   Cardiac Enzymes  Recent Labs  01/13/14 1119 01/13/14 1815 01/13/14 2355  TROPONINI 6.20* >20.00* 14.96*    Hemoglobin A1C  Recent Labs  01/13/14 1815  HGBA1C 8.0*   Thyroid Function Tests  Recent Labs  01/13/14 1815  TSH 4.190    TELE Sinus with freq PACs and some PVCs  Radiology/Studies  Dg Chest Portable 1 View  01/13/2014   CLINICAL DATA:  Epigastric pain and nausea  EXAM: PORTABLE CHEST - 1 VIEW  COMPARISON:  None  FINDINGS: Moderate cardiomegaly. Vascular congestion. Bilateral pleural effusions and bibasilar airspace edema. No pneumothorax.  IMPRESSION: CHF with bilateral effusions and pulmonary edema.   Electronically Signed   By: Maryclare Bean M.D.   On: 01/13/2014 12:21    ASSESSMENT AND PLAN Anita Roberts is a 80 y.o. female with a history of HTN, colon cancer, post op atrial fib s/p colon polyp removal and DM who presented to  [facility] this morning complaining of epigastric pain and nausea since last night. She has no past cardiac history but her troponin was noted to be elevated and cardiology was consulted.  Acute NSTEMI- She has no CP or SOB; however, worrisome for CAD in the setting of an elderly woman with DM.  -- Troponin 4.4--> 6.2--> >20 --> 14.96--> 7.67 -- BNP slightly elevated 2.4. Will order an ECHO to further evaluate LV function and WMAs. -- Continue heparin gtt. -- Allergy to ASA so will place on plavix, lopressor 12.5 mg BID and statin  -- Plan for [facility] today. NPO.   PAF- occurred once post operatively after colon cancer removal ~2006 -- This has been quiescent on diltiazem  -- Was on Coumadin for a short time but did have some bleeding and this was discontinued  -- Tele noted to be in NSR with freq PACs, however, she is at high risk to go back into afib.   HTN- continue home Benicar 40, diltiazem CD 180mg  and  lopressor 12.5mg  BID  DM- Hg A1c 8 -- Continue SSI    Signed, Eileen Stanford PA-C  Pager [phone]

## 2014-01-14 NOTE — H&P (View-Only) (Signed)
Patient Name: KEIA RASK Date of Encounter: 01/14/2014     Active Problems:   NSTEMI (non-ST elevated myocardial infarction)    SUBJECTIVE  Feeling better. No CP or SOB   CURRENT MEDS . atorvastatin  80 mg Oral q1800  . citalopram  20 mg Oral Daily  . clopidogrel  75 mg Oral Q breakfast  . diltiazem  180 mg Oral Daily  . insulin aspart  0-15 Units Subcutaneous TID WC  . irbesartan  300 mg Oral Daily  . metoprolol tartrate  12.5 mg Oral BID  . sodium chloride  3 mL Intravenous Q12H  . sodium chloride  3 mL Intravenous Q12H    OBJECTIVE  Filed Vitals:   01/13/14 1600 01/13/14 1706 01/13/14 2017 01/14/14 0517  BP: 140/71 147/74 129/59 126/51  Pulse: 92 89 74 69  Temp:  98 F (36.7 C) 98 F (36.7 C) 97.9 F (36.6 C)  TempSrc:  Oral Oral Oral  Resp: 15 16 18 16   Height:      Weight:  152 lb 6.4 oz (69.128 kg)  154 lb (69.854 kg)  SpO2: 98% 98% 97%     Intake/Output Summary (Last 24 hours) at 01/14/14 0635 Last data filed at 01/13/14 1621  Gross per 24 hour  Intake    120 ml  Output    500 ml  Net   -380 ml   Filed Weights   01/13/14 1308 01/13/14 1706 01/14/14 0517  Weight: 154 lb (69.854 kg) 152 lb 6.4 oz (69.128 kg) 154 lb (69.854 kg)    PHYSICAL EXAM  General: Pleasant, NAD. Neuro: Alert and oriented X 3. Moves all extremities spontaneously. Psych: Normal affect. HEENT:  Normal  Neck: Supple without bruits or JVD. Lungs:  Resp regular and unlabored, CTA. Heart: RRR no s3, s4, or murmurs. Abdomen: Soft, non-tender, non-distended, BS + x 4.  Extremities: No clubbing, cyanosis or edema. DP/PT/Radials 2+ and equal bilaterally.  Accessory Clinical Findings  CBC  Recent Labs  01/13/14 1030  WBC 9.6  NEUTROABS 7.9*  HGB 14.1  HCT 41.9  MCV 87.3  PLT 858   Basic Metabolic Panel  Recent Labs  01/13/14 1030  NA 138  K 4.0  CL 98  CO2 24  GLUCOSE 233*  BUN 13  CREATININE 0.65  CALCIUM 9.2   Liver Function Tests  Recent Labs  01/13/14 1030  AST 43*  ALT 19  ALKPHOS 78  BILITOT 1.1  PROT 7.8  ALBUMIN 3.9    Recent Labs  01/13/14 1030  LIPASE 15   Cardiac Enzymes  Recent Labs  01/13/14 1119 01/13/14 1815 01/13/14 2355  TROPONINI 6.20* >20.00* 14.96*    Hemoglobin A1C  Recent Labs  01/13/14 1815  HGBA1C 8.0*   Thyroid Function Tests  Recent Labs  01/13/14 1815  TSH 4.190    TELE Sinus with freq PACs and some PVCs  Radiology/Studies  Dg Chest Portable 1 View  01/13/2014   CLINICAL DATA:  Epigastric pain and nausea  EXAM: PORTABLE CHEST - 1 VIEW  COMPARISON:  None  FINDINGS: Moderate cardiomegaly. Vascular congestion. Bilateral pleural effusions and bibasilar airspace edema. No pneumothorax.  IMPRESSION: CHF with bilateral effusions and pulmonary edema.   Electronically Signed   By: Maryclare Bean M.D.   On: 01/13/2014 12:21    ASSESSMENT AND PLAN [place] KOYA HUNGER is a 80 y.o. female with a history of HTN, colon cancer, post op atrial fib s/p colon polyp removal and DM who presented to  [facility] this morning complaining of epigastric pain and nausea since last night. She has no past cardiac history but her troponin was noted to be elevated and cardiology was consulted.  Acute NSTEMI- She has no CP or SOB; however, worrisome for CAD in the setting of an elderly woman with DM.  -- Troponin 4.4--> 6.2--> >20 --> 14.96--> 7.67 -- BNP slightly elevated 2.4. Will order an ECHO to further evaluate LV function and WMAs. -- Continue heparin gtt. -- Allergy to ASA so will place on plavix, lopressor 12.5 mg BID and statin  -- Plan for [facility] today. NPO.   PAF- occurred once post operatively after colon cancer removal ~2006 -- This has been quiescent on diltiazem  -- Was on Coumadin for a short time but did have some bleeding and this was discontinued  -- Tele noted to be in NSR with freq PACs, however, she is at high risk to go back into afib.   HTN- continue home Benicar 40, diltiazem CD 180mg  and  lopressor 12.5mg  BID  DM- Hg A1c 8 -- Continue SSI    Signed, Eileen Stanford PA-C  Pager [phone]

## 2014-01-14 NOTE — CV Procedure (Addendum)
Cardiac Catheterization Operative Report  Anita Roberts [id number] 11/2/20153:50 PM No primary care provider on file.  Procedure Performed:  1. Left Heart Catheterization 2. Selective Coronary Angiography 3. PTCA/DES x 1 proximal Circumflex  Operator: Lauree Chandler, MD  Arterial access site:  Right radial artery.   Indication: 80 yo female with history of HTN, DM admitted with Nausea and epigastric pain and found to have NSTEMI.                                      Procedure Details: The risks, benefits, complications, treatment options, and expected outcomes were discussed with the patient. The patient and/or family concurred with the proposed plan, giving informed consent. The patient was brought to the cath lab after IV hydration was begun and oral premedication was given. The patient was not sedated. The right wrist was assessed with a modified Allens test which was positive. The right wrist was prepped and draped in a sterile fashion. 1% lidocaine was used for local anesthesia. Using the modified Seldinger access technique, a 5/6 French sheath was placed in the right radial artery. 3 mg Verapamil was given through the sheath. 3500 units IV heparin was given. Standard diagnostic catheters were used to perform selective coronary angiography. A pigtail catheter was used to measure LV pressures. She was found to have severe stenosis in the mid Circumflex with ostial Circumflex disease as well. This was felt to be the culprit vessel. I elected to proceed to PCI of the Circumflex artery.   PCI Note: She was given a weight based bolus of Angiomax and a drip was started. She has been on Plavix. Plavix 300 mg was given po x 1. When the ACT was over 200, I engaged the left main with a XB 3.0 guiding catheter with side holes. I then passed a Cougar IC wire down the Circumflex artery. I then pre-dilated the mid stenosis with a 2.5 x 15 mm balloon x 1. I then carefully positioned and  deployed a 2.75 x 22 mm Resolute DES extending from the ostium down into the mid vessel. The stenosis was taken from 99% down to 0%. I did not post-dilate the stent as it appeared to be appropriately sized. The sheath was removed from the right radial artery and a VascBand hemostasis band was applied at the arteriotomy site on the right wrist.   There were no immediate complications. The patient was taken to the recovery area in stable condition.   Hemodynamic Findings: Central aortic pressure: 138/68 Left ventricular pressure: 136/25/31  Angiographic Findings:  Left main: 50% ostial stenosis with diffuse 30% stenosis through the mid and distal segments.   Left Anterior Descending Artery: Large caliber vessel that courses to the apex. The proximal vessel has a 40% stenosis. The mid and distal vessel has mild diffuse plaque disease. The moderate caliber diagonal branch has diffuse 50% stenosis extending from the ostium down into the mid vessel where there is a 99% stenosis.   Circumflex Artery: Moderate caliber vessel with termination into a moderate caliber obtuse marginal branch. There is a 70-80% ostial stenosis. There is a 99% stenosis in the mid vessel extending into the obtuse marginal branch. There is a small intermediate branch.   Right Coronary Artery: Large dominant vessel with diffuse 30% stenosis in the proximal, mid and distal vessel. The PDA and PLA are both patent with diffuse plaque  disease.   Left Ventricular Angiogram: Deferred.   Impression: 1. NSTEMI secondary to severe stenosis mid Circumflex 2. Successful PTCA/DES x 1 proximal/mid Circumflex 3. Severe stenosis involving the entire proximal and mid segment of the moderate caliber diagonal branch.  4. Mild to moderate non-obstructive disease in the LAD and RCA  Recommendations: Continue Plavix. She is intolerant of ASA. She has not been felt to be a coumadin candidate in the past. Continue statin and beta blocker. Will  attempt medical management of the diffuse disease in the diagonal branch.        Complications:  None. The patient tolerated the procedure well.

## 2014-01-14 NOTE — Plan of Care (Signed)
Problem: Phase I Progression Outcomes Goal: 12 lead ECG and Lab on chart Outcome: Completed/Met Date Met:  01/14/14

## 2014-01-15 DIAGNOSIS — Z955 Presence of coronary angioplasty implant and graft: Secondary | ICD-10-CM

## 2014-01-15 DIAGNOSIS — I5043 Acute on chronic combined systolic (congestive) and diastolic (congestive) heart failure: Secondary | ICD-10-CM

## 2014-01-15 LAB — BASIC METABOLIC PANEL
Anion gap: 12 (ref 5–15)
BUN: 14 mg/dL (ref 6–23)
CO2: 25 mEq/L (ref 19–32)
Calcium: 8.1 mg/dL — ABNORMAL LOW (ref 8.4–10.5)
Chloride: 103 mEq/L (ref 96–112)
Creatinine, Ser: 0.8 mg/dL (ref 0.50–1.10)
GFR calc Af Amer: 74 mL/min — ABNORMAL LOW (ref >90)
GFR, EST NON AFRICAN AMERICAN: 64 mL/min — AB (ref >90)
GLUCOSE: 130 mg/dL — AB (ref 70–99)
Potassium: 3.4 mEq/L — ABNORMAL LOW (ref 3.7–5.3)
SODIUM: 140 meq/L (ref 137–147)

## 2014-01-15 LAB — GLUCOSE, CAPILLARY
GLUCOSE-CAPILLARY: 141 mg/dL — AB (ref 70–99)
GLUCOSE-CAPILLARY: 212 mg/dL — AB (ref 70–99)
GLUCOSE-CAPILLARY: 158 mg/dL — AB (ref 70–99)
Glucose-Capillary: 200 mg/dL — ABNORMAL HIGH (ref 70–99)

## 2014-01-15 LAB — CBC
HEMATOCRIT: 34.7 % — AB (ref 36.0–46.0)
HEMOGLOBIN: 11.5 g/dL — AB (ref 12.0–15.0)
MCH: 29.3 pg (ref 26.0–34.0)
MCHC: 33.1 g/dL (ref 30.0–36.0)
MCV: 88.5 fL (ref 78.0–100.0)
Platelets: 188 10*3/uL (ref 150–400)
RBC: 3.92 MIL/uL (ref 3.87–5.11)
RDW: 13.9 % (ref 11.5–15.5)
WBC: 8.1 10*3/uL (ref 4.0–10.5)

## 2014-01-15 MED ORDER — POTASSIUM CHLORIDE CRYS ER 20 MEQ PO TBCR
40.0000 meq | EXTENDED_RELEASE_TABLET | Freq: Once | ORAL | Status: AC
  Administered 2014-01-15: 40 meq via ORAL
  Filled 2014-01-15: qty 2

## 2014-01-15 MED ORDER — FUROSEMIDE 10 MG/ML IJ SOLN
INTRAMUSCULAR | Status: AC
  Filled 2014-01-15: qty 4

## 2014-01-15 MED ORDER — FUROSEMIDE 10 MG/ML IJ SOLN
20.0000 mg | Freq: Once | INTRAMUSCULAR | Status: AC
  Administered 2014-01-15: 10:00:00 20 mg via INTRAVENOUS

## 2014-01-15 MED FILL — Sodium Chloride IV Soln 0.9%: INTRAVENOUS | Qty: 50 | Status: AC

## 2014-01-15 NOTE — Plan of Care (Signed)
Problem: Phase I Progression Outcomes Goal: Initial discharge plan identified Outcome: Completed/Met Date Met:  01/15/14     

## 2014-01-15 NOTE — Progress Notes (Signed)
Received low dose BB and heart rate in the 40-60 in and out of Afib and NSR asymptomatic.

## 2014-01-15 NOTE — Plan of Care (Signed)
Problem: Phase I Progression Outcomes Goal: Hemodynamically stable Outcome: Completed/Met Date Met:  01/15/14     

## 2014-01-15 NOTE — Clinical Documentation Improvement (Addendum)
  80 year old female admitted with NSTEMI s/p DES to proximal circumflex artery.  CHF noted on admission CXR.  Rales noted per ED physician assessment.  Documented to be a bit wheezy on 01/14/14, CHF suspected, IV Lasix 20 mg given and IV fluids decreased to 50 ml/hr. JVD documented in progress note 01/15/14 and IV Lasix repeated.  Echo completed 01/14/14 - ef 50-55%, mod AI, MR, TR.  Peak PA systolic 71QR/FX. Hypokinesis of the mid-apicalanterolateral and inferolateral myocardium; consistent with infarction in the distribution of the left circumflex coronary artery.   Please document the acuity and type of CHF treated with IV Lasix.  Thank You, Erling Conte ,RN Clinical Documentation Specialist:  [phone] [place] [employer] Management  Acute combined systolic and diastolic congestive heart failure.  Pixie Casino, MD, [facility] Attending Cardiologist [place]

## 2014-01-15 NOTE — Progress Notes (Signed)
Subjective: No CP or SOB  Objective: Vital signs in last 24 hours: Temp:  [97.7 F (36.5 C)-98.4 F (36.9 C)] 98.3 F (36.8 C) (11/03 0514) Pulse Rate:  [50-79] 52 (11/03 0514) Resp:  [16-20] 20 (11/03 0514) BP: (103-135)/(37-80) 110/43 mmHg (11/03 0514) SpO2:  [84 %-97 %] 94 % (11/03 0514) Weight:  [154 lb 15.7 oz (70.3 kg)] 154 lb 15.7 oz (70.3 kg) (11/03 0228) Last BM Date: 01/14/14  Intake/Output from previous day: 11/02 0701 - 11/03 0700 In: 249.2 [P.O.:120; I.V.:129.2] Out: 400 [Urine:400] Intake/Output this shift:    Medications Current Facility-Administered Medications  Medication Dose Route Frequency Provider Last Rate Last Dose  . 0.9 %  sodium chloride infusion  250 mL Intravenous PRN Eileen Stanford, PA-C      . acetaminophen (TYLENOL) tablet 650 mg  650 mg Oral Q4H PRN Eileen Stanford, PA-C      . atorvastatin (LIPITOR) tablet 80 mg  80 mg Oral q1800 Eileen Stanford, PA-C   80 mg at 01/14/14 2218  . citalopram (CELEXA) tablet 20 mg  20 mg Oral Daily Eileen Stanford, PA-C   20 mg at 01/14/14 1042  . clopidogrel (PLAVIX) tablet 75 mg  75 mg Oral Q breakfast Eileen Stanford, PA-C   75 mg at 01/14/14 6294  . diltiazem (CARDIZEM CD) 24 hr capsule 180 mg  180 mg Oral Daily Eileen Stanford, PA-C   180 mg at 01/14/14 1043  . insulin aspart (novoLOG) injection 0-15 Units  0-15 Units Subcutaneous TID WC Eileen Stanford, PA-C   Stopped at 01/14/14 0700  . irbesartan (AVAPRO) tablet 300 mg  300 mg Oral Daily Eileen Stanford, PA-C   300 mg at 01/14/14 1043  . metoprolol tartrate (LOPRESSOR) tablet 12.5 mg  12.5 mg Oral BID Eileen Stanford, PA-C   12.5 mg at 01/14/14 2218  . nitroGLYCERIN (NITROSTAT) SL tablet 0.4 mg  0.4 mg Sublingual Q5 Min x 3 PRN Eileen Stanford, PA-C      . ondansetron [facility]) injection 4 mg  4 mg Intravenous Q6H PRN Eileen Stanford, PA-C      . potassium chloride SA (K-DUR,KLOR-CON) CR tablet 40 mEq  40 mEq Oral Once  Brett Canales, PA-C      . sodium chloride 0.9 % injection 3 mL  3 mL Intravenous Q12H Eileen Stanford, PA-C   3 mL at 01/14/14 2218  . sodium chloride 0.9 % injection 3 mL  3 mL Intravenous PRN Eileen Stanford, PA-C        PE: General appearance: alert, cooperative and no distress Lungs: Decreased BS in the bases.  No wheeze or rhonchi Heart: regular rate and rhythm, S1, S2 normal, no murmur, click, rub or gallop Extremities: No LEE Pulses: 2+ and symmetric Skin: Warm and dry.  Hands feel very dry.  Right wrist:  No ecchymosis. Neurologic: Grossly normal  Lab Results:   Recent Labs  01/13/14 1030 01/14/14 0619 01/15/14 0435  WBC 9.6 8.5 8.1  HGB 14.1 11.9* 11.5*  HCT 41.9 35.6* 34.7*  PLT 216 198 188   BMET  Recent Labs  01/13/14 1030 01/14/14 0619 01/15/14 0435  NA 138 136* 140  K 4.0 3.7 3.4*  CL 98 100 103  CO2 24 24 25   GLUCOSE 233* 149* 130*  BUN 13 12 14   CREATININE 0.65 0.71 0.80  CALCIUM 9.2 8.2* 8.1*   PT/INR  Recent Labs  01/13/14 1815 01/14/14 7654  LABPROT 14.9 15.5*  INR 1.16 1.22   Cholesterol  Recent Labs  01/14/14 0619  CHOL 147   Lipid Panel     Component Value Date/Time   CHOL 147 01/14/2014 0619   TRIG 65 01/14/2014 0619   HDL 53 01/14/2014 0619   CHOLHDL 2.8 01/14/2014 0619   VLDL 13 01/14/2014 0619   LDLCALC 81 01/14/2014 0619   Cardiac Panel (last 3 results)  Recent Labs  01/13/14 1815 01/13/14 2355 01/14/14 0619  TROPONINI >20.00* 14.96* 7.67*      Assessment/Plan  Anita Roberts is a 80 y.o. female with a history of HTN, colon cancer, post op atrial fib s/p colon polyp removal and DM who presented to [facility] this morning complaining of epigastric pain and nausea since last night. She has no past cardiac history but her troponin Active Problems:   NSTEMI (non-ST elevated myocardial infarction)   PAF   HTN   NSVT   Bradycardia   Hypokalemia   Pleural Effusions, bilateral  Plan:   SP LHC revealing severe  stenosis mid Circumflex.  She underwent successful PTCA/DES x 1 proximal/mid Circumflex. Severe stenosis involving the entire proximal and mid segment of the moderate caliber diagonal branch. Mild to moderate non-obstructive disease in the LAD and RCA.  Plavix.  Intolerant to ASA.  Statin and BB.  Medical Mgt for residual disease.   SCr stable.     Echo:  EF 50-55%,  Hypokinesis of themid-apicalanterolateral and inferolateral myocardium.  Mild to mod AI, Mod MR, LA/RA mod to sev dilation, mod TR.  Peak PA pressure 29mmHg.  Short runs of afib.  Bradycardia into the 40's.  Currently 60.  6 beat NSVT yesterday at 1000hrs.  Lopressor at 12.5mg  BID.  Will not increase at this time.   BP controlled.   Ambulated with CR this morning.  Lives independently.  Potential DC home tomorrow.    Replaced K+  I/O's not charted.  Decreased BS at bases.  JVD looks elevated.  Will give another 20 mg IV lasix.  Repeat CXr in the morning.    LOS: 2 days    Michell Kader PA-C 01/15/2014 8:01 AM

## 2014-01-15 NOTE — Progress Notes (Signed)
CARDIAC REHAB PHASE I   PRE:  Rate/Rhythm: 62-73 SR  BP:  Supine:   Sitting: 120/51  Standing:    SaO2:   MODE:  Ambulation: 140 ft   POST:  Rate/Rhythm: 80 SR  BP:  Supine:   Sitting: 112/41  Standing:    SaO2:  [id number] Pt walked 140 ft with asst x 1 and holding to side rail. Denied using cane or walker at home and did not feel that she wanted to use walker here. Pt tired quickly with short walk. To recliner with call bell after walk. MI education completed with pt who voiced understanding. Has stent card and I stressed importance of taking plavix. Gave diabetic diet and discussed carb counting. Reviewed NTG use. Discussed CRP 2 and pt agreed to referral but may wait until Spring to attend since she does not like to get out in cold to drive. Will refer and let Phase 2 follow up. No CP with walk. Would recommend that pt walk with staff today.   Graylon Good, RN BSN  01/15/2014 8:44 AM

## 2014-01-16 ENCOUNTER — Inpatient Hospital Stay (HOSPITAL_COMMUNITY): Payer: Medicare Other

## 2014-01-16 ENCOUNTER — Encounter (HOSPITAL_COMMUNITY): Payer: Self-pay | Admitting: Physician Assistant

## 2014-01-16 DIAGNOSIS — E876 Hypokalemia: Secondary | ICD-10-CM | POA: Diagnosis not present

## 2014-01-16 LAB — BASIC METABOLIC PANEL
ANION GAP: 11 (ref 5–15)
BUN: 15 mg/dL (ref 6–23)
CO2: 25 mEq/L (ref 19–32)
Calcium: 8.5 mg/dL (ref 8.4–10.5)
Chloride: 102 mEq/L (ref 96–112)
Creatinine, Ser: 0.81 mg/dL (ref 0.50–1.10)
GFR, EST AFRICAN AMERICAN: 73 mL/min — AB (ref >90)
GFR, EST NON AFRICAN AMERICAN: 63 mL/min — AB (ref >90)
Glucose, Bld: 151 mg/dL — ABNORMAL HIGH (ref 70–99)
POTASSIUM: 3.3 meq/L — AB (ref 3.7–5.3)
SODIUM: 138 meq/L (ref 137–147)

## 2014-01-16 LAB — GLUCOSE, CAPILLARY: Glucose-Capillary: 166 mg/dL — ABNORMAL HIGH (ref 70–99)

## 2014-01-16 LAB — PRO B NATRIURETIC PEPTIDE: Pro B Natriuretic peptide (BNP): 2378 pg/mL — ABNORMAL HIGH (ref 0–450)

## 2014-01-16 MED ORDER — CLOPIDOGREL BISULFATE 75 MG PO TABS: 75.0000 mg | ORAL_TABLET | Freq: Every day | ORAL | Status: DC

## 2014-01-16 MED ORDER — POTASSIUM CHLORIDE CRYS ER 20 MEQ PO TBCR
40.0000 meq | EXTENDED_RELEASE_TABLET | Freq: Once | ORAL | Status: AC
  Administered 2014-01-16: 11:00:00 40 meq via ORAL
  Filled 2014-01-16: qty 2

## 2014-01-16 MED ORDER — METOPROLOL SUCCINATE ER 25 MG PO TB24: 25.0000 mg | ORAL_TABLET | Freq: Every day | ORAL | Status: DC

## 2014-01-16 MED ORDER — ATORVASTATIN CALCIUM 80 MG PO TABS: 80.0000 mg | ORAL_TABLET | Freq: Every day | ORAL | Status: DC

## 2014-01-16 MED ORDER — NITROGLYCERIN 0.4 MG SL SUBL: 0.4000 mg | SUBLINGUAL_TABLET | SUBLINGUAL | Status: DC | PRN

## 2014-01-16 NOTE — Discharge Summary (Signed)
CARDIOLOGY DISCHARGE SUMMARY   Patient ID: Anita Roberts MRN: [id number] DOB/AGE: [birth year] 80 y.o.  Admit date: 01/13/2014 Discharge date: 01/16/2014  PCP: No primary care provider on file. Primary Cardiologist: Dr. Debara Pickett  Primary Discharge Diagnosis:   NSTEMI (non-ST elevated myocardial infarction) - s/p 2.75 x 22 mm Resolute DES to the circumflex  Secondary Discharge Diagnosis:    History of atrial fibrillation   DM II (diabetes mellitus, type II), controlled   Acute on chronic combined systolic and diastolic congestive heart failure, NYHA class 3 - discharge weight 156 pounds   S/P coronary artery stent placement   Hypokalemia  Procedure Performed:  1. Left Heart Catheterization 2. Selective Coronary Angiography 3. PTCA/DES x 1 proximal Circumflex  Hospital Course: Anita Roberts is a 80 y.o. female with no previous history of CAD.she has a history of HTN, colon cancer, post op atrial fib s/p colon polyp removal and DM. She came to the hospital on 11/01 with epigastric pain and nausea. Her troponin was elevated and she was admitted with a non-STEMI, for further evaluation and treatment.  Her symptoms were controlled with aspirin, heparin, nitrates, beta blocker and a statin. She was taken to the cath lab on 11/02, results are below. She had a drug-eluting stent to the circumflex, otherwise medical therapy. She tolerated the procedure well.  After the procedure, she was noted to have some volume overload. A chest x-ray is blown showed heart failure. She was diuresed with IV Lasix and responded well to the diuretic. As she diuresed, her respiratory status improved. Hopefully, she will not have a chronic issue with volume overload. Currently, she is to track her weight but will not be on a standing diuretic dose. As she recovers from her MI, a diuretic may not be necessary.  Her diabetes was controlled with sliding scale insulin. Hemoglobin A1c is elevated at 8.0. Dietary  compliance with a low sodium diabetic diet is encouraged and she is to follow-up with primary care.  On 11/04, she was seen by Dr. Debara Pickett and all data were reviewed. Potassium was low and was supplemented. Her volume status was felt stable. She was ambulating with cardiac rehabilitation and educated on MI restrictions, stent guidelines in heart lifestyle modifications. Her daughter is here from [place] and will assist in making sure that she will be stable at home. No further inpatient workup is indicated and she is considered stable for discharge, to follow up as an outpatient.  Labs:   Lab Results  Component Value Date   WBC 8.1 01/15/2014   HGB 11.5* 01/15/2014   HCT 34.7* 01/15/2014   MCV 88.5 01/15/2014   PLT 188 01/15/2014    Recent Labs Lab 01/14/14 0619  01/16/14 0333  NA 136*  < > 138  K 3.7  < > 3.3*  CL 100  < > 102  CO2 24  < > 25  BUN 12  < > 15  CREATININE 0.71  < > 0.81  CALCIUM 8.2*  < > 8.5  PROT 6.1  --   --   BILITOT 0.8  --   --   ALKPHOS 59  --   --   ALT 18  --   --   AST 58*  --   --   GLUCOSE 149*  < > 151*  < > = values in this interval not displayed.  Recent Labs  01/13/14 1815 01/13/14 2355 01/14/14 0619  TROPONINI >20.00* 14.96* 7.67*   Lipid  Panel     Component Value Date/Time   CHOL 147 01/14/2014 0619   TRIG 65 01/14/2014 0619   HDL 53 01/14/2014 0619   CHOLHDL 2.8 01/14/2014 0619   VLDL 13 01/14/2014 0619   LDLCALC 81 01/14/2014 0619    PRO B NATRIURETIC PEPTIDE (BNP)  Date/Time Value Ref Range Status  01/16/2014 03:33 AM 2378.0* 0 - 450 pg/mL Final  01/13/2014 11:19 AM 2416.0* 0 - 450 pg/mL Final   Lab Results  Component Value Date   HGBA1C 8.0* 01/13/2014    Recent Labs  01/14/14 0619  INR 1.22   Radiology: Dg Chest 2 View 01/16/2014   CLINICAL DATA:  Congestive heart failure. An known pleural effusions  EXAM: CHEST  2 VIEW  COMPARISON:  Portable chest x-ray of January 23, 2014.  FINDINGS: The lungs are better  inflated today. The interstitial markings are slightly less prominent. There is bibasilar atelectasis. There are stable bilateral pleural effusions slightly greater on the left than on the right. The cardiac silhouette remains mildly enlarged. The pulmonary vascularity is prominent centrally. The bony thorax is unremarkable.  IMPRESSION: There has been slight interval improvement in the aeration of both lungs. CHF with stable small to moderate-sized bilateral pleural effusions.   Electronically Signed   By: David  Martinique   On: 01/16/2014 08:15   Dg Chest Portable 1 View 01/13/2014   CLINICAL DATA:  Epigastric pain and nausea  EXAM: PORTABLE CHEST - 1 VIEW  COMPARISON:  None  FINDINGS: Moderate cardiomegaly. Vascular congestion. Bilateral pleural effusions and bibasilar airspace edema. No pneumothorax.  IMPRESSION: CHF with bilateral effusions and pulmonary edema.   Electronically Signed   By: Maryclare Bean M.D.   On: 01/13/2014 12:21   Cardiac Cath: 01/14/2014 Left main: 50% ostial stenosis with diffuse 30% stenosis through the mid and distal segments.  Left Anterior Descending Artery: Large caliber vessel that courses to the apex. The proximal vessel has a 40% stenosis. The mid and distal vessel has mild diffuse plaque disease. The moderate caliber diagonal branch has diffuse 50% stenosis extending from the ostium down into the mid vessel where there is a 99% stenosis.  Circumflex Artery: Moderate caliber vessel with termination into a moderate caliber obtuse marginal branch. There is a 70-80% ostial stenosis. There is a 99% stenosis in the mid vessel extending into the obtuse marginal branch. There is a small intermediate branch.  Right Coronary Artery: Large dominant vessel with diffuse 30% stenosis in the proximal, mid and distal vessel. The PDA and PLA are both patent with diffuse plaque disease.  Left Ventricular Angiogram: Deferred.  Impression: 1. NSTEMI secondary to severe stenosis mid  Circumflex 2. Successful PTCA/DES x 1 proximal/mid Circumflex 3. Severe stenosis involving the entire proximal and mid segment of the moderate caliber diagonal branch.  4. Mild to moderate non-obstructive disease in the LAD and RCA Recommendations: Continue Plavix. She is intolerant of ASA. She has not been felt to be a coumadin candidate in the past. Continue statin and beta blocker. Will attempt medical management of the diffuse disease in the diagonal branch.   EKG: 01/15/2014 Possible short PR at times, otherwise sinus rhythm  Echo: 01/14/2014 Conclusions - Left ventricle: The cavity size was normal. Wall thickness was normal. Systolic function was normal. The estimated ejection fraction was in the range of 50% to 55%. Hypokinesis of the mid-apicalanterolateral and inferolateral myocardium; consistent with infarction in the distribution of the left circumflex coronary artery. - Aortic valve: There was mild to  moderate regurgitation directed centrally in the LVOT. - Mitral valve: Calcified annulus. There was moderate regurgitation directed centrally. - Left atrium: The atrium was moderately to severely dilated. - Right atrium: The atrium was moderately to severely dilated. - Tricuspid valve: There was moderate regurgitation. - Pulmonary arteries: Systolic pressure was moderately increased. PA peak pressure: 56 mm Hg (S). - Pericardium, extracardiac: A trivial pericardial effusion was identified. There was a left pleural effusion. Impressions: - Compared to 2006 there is a new wall motion abnormality.  FOLLOW UP PLANS AND APPOINTMENTS Allergies  Allergen Reactions  . Coumadin [Warfarin]     Extreme bleeding  . Shellfish Allergy     Very alert.  Unable to sleep  . Versed [Midazolam] Nausea And Vomiting  . Aspirin Rash  . Penicillins Rash  . Sulfa Antibiotics Rash     Medication List    TAKE these medications        atorvastatin 80 MG tablet   Commonly known as:  LIPITOR  Take 1 tablet (80 mg total) by mouth daily at 6 PM.     citalopram 20 MG tablet  Commonly known as:  CELEXA  Take 20 mg by mouth daily.     clopidogrel 75 MG tablet  Commonly known as:  PLAVIX  Take 1 tablet (75 mg total) by mouth daily with breakfast.     diltiazem 180 MG 24 hr capsule  Commonly known as:  DILACOR XR  Take 180 mg by mouth daily.     glipiZIDE 10 MG tablet  Commonly known as:  GLUCOTROL  Take 10 mg by mouth 2 (two) times daily before a meal.     metoprolol succinate 25 MG 24 hr tablet  Commonly known as:  TOPROL XL  Take 1 tablet (25 mg total) by mouth daily.     nitroGLYCERIN 0.4 MG SL tablet  Commonly known as:  NITROSTAT  Place 1 tablet (0.4 mg total) under the tongue every 5 (five) minutes as needed for chest pain.     olmesartan 40 MG tablet  Commonly known as:  BENICAR  Take 40 mg by mouth daily.     PRESCRIPTION MEDICATION  Place 1 drop into the left eye 3 (three) times daily. Sample eye drops for cataract surgery     PRESCRIPTION MEDICATION  Place 1 drop into the left eye 2 (two) times daily. Sample eye drops for cataract surgery     PRESCRIPTION MEDICATION  Place 1 drop into the left eye at bedtime. Sample eye drop for cataract surgery        Discharge Instructions    (HEART FAILURE PATIENTS) Call MD:  Anytime you have any of the following symptoms: 1) 3 pound weight gain in 24 hours or 5 pounds in 1 week 2) shortness of breath, with or without a dry hacking cough 3) swelling in the hands, feet or stomach 4) if you have to sleep on extra pillows at night in order to breathe.    Complete by:  As directed      Amb Referral to Cardiac Rehabilitation    Complete by:  As directed      Diet - low sodium heart healthy    Complete by:  As directed      Diet Carb Modified    Complete by:  As directed      Increase activity slowly    Complete by:  As directed           Follow-up Information  Follow up with  Pixie Casino, MD.   Specialty:  Cardiology   Why:  The office will call.   Contact information:   [place] [address] [phone]       BRING ALL MEDICATIONS WITH YOU TO FOLLOW UP APPOINTMENTS  Time spent with patient to include physician time: 42 min Signed: Rosaria Ferries, PA-C 01/16/2014, 10:17 AM Co-Sign MD

## 2014-01-16 NOTE — Progress Notes (Signed)
CARDIAC REHAB PHASE I   PRE:  Rate/Rhythm: 70 SR PACs  BP:  Supine:   Sitting: 146/57  Standing:    SaO2:   MODE:  Ambulation: 250 ft Then 100 ft with rolling walker  POST:  Rate/Rhythm: 88 SR  BP:  Supine:   Sitting: 134/62  Standing:    SaO2:  8264-1583 Pt walked 250 ft holding to side rail and hand held asst with steady gait. Pt receptive to walker today so then walked 100 ft with pt using rolling walker independently. Pt would like a walker for home use and would prefer a rollator. No CP. Seems stronger today. To recliner with call bell.    Graylon Good, RN BSN  01/16/2014 8:29 AM

## 2014-01-16 NOTE — Progress Notes (Signed)
Patient Name: Anita Roberts Date of Encounter: 01/16/2014  Principal Problem:   NSTEMI (non-ST elevated myocardial infarction) Active Problems:   History of atrial fibrillation   DM II (diabetes mellitus, type II), controlled   Acute on chronic combined systolic and diastolic congestive heart failure, NYHA class 3   S/P coronary artery stent placement   Hypokalemia    Patient Profile: 80 yo female admitted 11/01 w/ NSTEMI, s/p DES p-CFX. S-D-CHF rx, PAS 56, EF 55% +WMA  SUBJECTIVE: Breathing better today, no chest pain. No palpitations.  OBJECTIVE Filed Vitals:   01/15/14 2039 01/15/14 2353 01/16/14 0019 01/16/14 0545  BP: 140/43 115/59  143/65  Pulse: 63 57  62  Temp: 97.2 F (36.2 C) 97.6 F (36.4 C)  98 F (36.7 C)  TempSrc: Oral Oral  Oral  Resp: 15 17  20   Height:      Weight:   156 lb 1.4 oz (70.8 kg)   SpO2: 95% 94%  92%    Intake/Output Summary (Last 24 hours) at 01/16/14 0751 Last data filed at 01/15/14 1858  Gross per 24 hour  Intake    780 ml  Output    400 ml  Net    380 ml   Filed Weights   01/14/14 0517 01/15/14 0228 01/16/14 0019  Weight: 154 lb (69.854 kg) 154 lb 15.7 oz (70.3 kg) 156 lb 1.4 oz (70.8 kg)    PHYSICAL EXAM General: Well developed, well nourished, female in no acute distress. Head: Normocephalic, atraumatic.  Neck: Supple without bruits, JVD minimal elevation. Lungs:  Resp regular and unlabored, few rales bases. Heart: RRR, S1, S2, no S3, S4, or murmur; no rub. Abdomen: Soft, non-tender, non-distended, BS + x 4.  Extremities: No clubbing, cyanosis, no edema. Right radial cath site without ecchymosis/hematoma Neuro: Alert and oriented X 3. Moves all extremities spontaneously. Psych: Normal affect.  LABS: CBC:  Recent Labs  01/13/14 1030 01/14/14 0619 01/15/14 0435  WBC 9.6 8.5 8.1  NEUTROABS 7.9*  --   --   HGB 14.1 11.9* 11.5*  HCT 41.9 35.6* 34.7*  MCV 87.3 87.3 88.5  PLT 216 198 188   INR:  Recent Labs  01/14/14 0619  INR 6.55   Basic Metabolic Panel:  Recent Labs  01/15/14 0435 01/16/14 0333  NA 140 138  K 3.4* 3.3*  CL 103 102  CO2 25 25  GLUCOSE 130* 151*  BUN 14 15  CREATININE 0.80 0.81  CALCIUM 8.1* 8.5   Liver Function Tests:  Recent Labs  01/13/14 1030 01/14/14 0619  AST 43* 58*  ALT 19 18  ALKPHOS 78 59  BILITOT 1.1 0.8  PROT 7.8 6.1  ALBUMIN 3.9 2.9*   Cardiac Enzymes:  Recent Labs  01/13/14 1815 01/13/14 2355 01/14/14 0619  TROPONINI >20.00* 14.96* 7.67*    Recent Labs  01/13/14 1043  TROPIPOC 4.40*   BNP: PRO B NATRIURETIC PEPTIDE (BNP)  Date/Time Value Ref Range Status  01/16/2014 03:33 AM 2378.0* 0 - 450 pg/mL Final  01/13/2014 11:19 AM 2416.0* 0 - 450 pg/mL Final   Hemoglobin A1C:  Recent Labs  01/13/14 1815  HGBA1C 8.0*   Fasting Lipid Panel:  Recent Labs  01/14/14 0619  CHOL 147  HDL 53  LDLCALC 81  TRIG 65  CHOLHDL 2.8   Thyroid Function Tests:  Recent Labs  01/13/14 1815  TSH 4.190   TELE:  SR, ?WAP  Radiology/Studies: Dg Chest Portable 1 View  01/13/2014  CLINICAL DATA:  Epigastric pain and nausea  EXAM: PORTABLE CHEST - 1 VIEW  COMPARISON:  None  FINDINGS: Moderate cardiomegaly. Vascular congestion. Bilateral pleural effusions and bibasilar airspace edema. No pneumothorax.  IMPRESSION: CHF with bilateral effusions and pulmonary edema.   Electronically Signed   By: Maryclare Bean M.D.   On: 01/13/2014 12:21    Current Medications:  . atorvastatin  80 mg Oral q1800  . citalopram  20 mg Oral Daily  . clopidogrel  75 mg Oral Q breakfast  . diltiazem  180 mg Oral Daily  . insulin aspart  0-15 Units Subcutaneous TID WC  . irbesartan  300 mg Oral Daily  . metoprolol tartrate  12.5 mg Oral BID  . sodium chloride  3 mL Intravenous Q12H   Medication Sig  citalopram (CELEXA) 20 MG tablet Take 20 mg by mouth daily.  diltiazem (DILACOR XR) 180 MG 24 hr capsule Take 180 mg by mouth daily.  glipiZIDE (GLUCOTROL) 10 MG  tablet Take 10 mg by mouth 2 (two) times daily before a meal.  olmesartan (BENICAR) 40 MG tablet Take 40 mg by mouth daily.  PRESCRIPTION MEDICATION Place 1 drop into the left eye 3 (three) times daily. Sample eye drops for cataract surgery  PRESCRIPTION MEDICATION Place 1 drop into the left eye 2 (two) times daily. Sample eye drops for cataract surgery  PRESCRIPTION MEDICATION Place 1 drop into the left eye at bedtime. Sample eye drop for cataract surgery     ASSESSMENT AND PLAN: Principal Problem:   NSTEMI (non-ST elevated myocardial infarction) - s/p DES pCFX, on Plavix, ASA allergic, high-dose statin, BB.   Active Problems:   History of atrial fibrillation - MD review telemetry, think WAP. Not on chronic anticoag.    DM II (diabetes mellitus, type II), controlled - A1c 8.0, f/u with primary MD, resume glucotrol    Acute on chronic combined systolic and diastolic congestive heart failure, NYHA class 3 - volume status improved, MD advise on adding diuretic chronically, not on now, rec'd Lasix 20 mg IV x 2 doses.    S/P coronary artery stent placement - see above    Hypokalemia - rec'd 40 meq yest without improvement, will give another 40 meq today, more if gets Lasix.  Plan - ambulate and see how tolerated, d/c when medically stable.   Jonetta Speak , PA-C 7:51 AM 01/16/2014

## 2014-01-16 NOTE — Discharge Instructions (Signed)
PLEASE REMEMBER TO BRING ALL OF YOUR MEDICATIONS TO EACH OF YOUR FOLLOW-UP OFFICE VISITS. ° °PLEASE ATTEND ALL SCHEDULED FOLLOW-UP APPOINTMENTS.  ° °Activity: Increase activity slowly as tolerated. You may shower, but no soaking baths (or swimming) for 1 week. No driving for 2 days. No lifting over 5 lbs for 1 week. No sexual activity for 1 week.  ° °You May Return to Work: in 1 week (if applicable) ° °Wound Care: You may wash cath site gently with soap and water. Keep cath site clean and dry. If you notice pain, swelling, bleeding or pus at your cath site, please call [phone]. ° ° ° °Cardiac Cath Site Care °Refer to this sheet in the next few weeks. These instructions provide you with information on caring for yourself after your procedure. Your caregiver may also give you more specific instructions. Your treatment has been planned according to current medical practices, but problems sometimes occur. Call your caregiver if you have any problems or questions after your procedure. °HOME CARE INSTRUCTIONS °· You may shower 24 hours after the procedure. Remove the bandage (dressing) and gently wash the site with plain soap and water. Gently pat the site dry.  °· Do not apply powder or lotion to the site.  °· Do not sit in a bathtub, swimming pool, or whirlpool for 5 to 7 days.  °· No bending, squatting, or lifting anything over 10 pounds (4.5 kg) as directed by your caregiver.  °· Inspect the site at least twice daily.  °· Do not drive home if you are discharged the same day of the procedure. Have someone else drive you.  °· You may drive 24 hours after the procedure unless otherwise instructed by your caregiver.  °What to expect: °· Any bruising will usually fade within 1 to 2 weeks.  °· Blood that collects in the tissue (hematoma) may be painful to the touch. It should usually decrease in size and tenderness within 1 to 2 weeks.  °SEEK IMMEDIATE MEDICAL CARE IF: °· You have unusual pain at the site or down the  affected limb.  °· You have redness, warmth, swelling, or pain at the site.  °· You have drainage (other than a small amount of blood on the dressing).  °· You have chills.  °· You have a fever or persistent symptoms for more than 72 hours.  °· You have a fever and your symptoms suddenly get worse.  °· Your leg becomes pale, cool, tingly, or numb.  °· You have heavy bleeding from the site. Hold pressure on the site.  °Document Released: 04/03/2010 Document Revised: 02/18/2011 Document Reviewed: 04/03/2010 °ExitCare® Patient Information ©2012 [employer]. ° °

## 2014-01-16 NOTE — Care Management Note (Addendum)
  Page 1 of 1   01/16/2014     10:53:35 AM CARE MANAGEMENT NOTE 01/16/2014  Patient:  Anita Roberts, Anita Roberts   Account Number:  [id number]  Date Initiated:  01/16/2014  Documentation initiated by:  Donyel Nester  Subjective/Objective Assessment:   NSTEMI     Action/Plan:   CM to follow for disposition needs   Anticipated DC Date:  01/16/2014   Anticipated DC Plan:  [place]  CM consult      Choice offered to / List presented to:     DME arranged  [name]      DME agency  [name]        Status of service:  Completed, signed off Medicare Important Message given?   (If response is "NO", the following Medicare IM given date fields will be blank) Date Medicare IM given:   Medicare IM given by:   Date Additional Medicare IM given:   Additional Medicare IM given by:    Discharge Disposition:  HOME/SELF CARE  Per UR Regulation:  Reviewed for med. necessity/level of care/duration of stay  If discussed at [place] of Stay Meetings, dates discussed:    Comments:  01/16/14 0930 [name], RN, BSN, [employer] [phone] Spoke with pt and daughter at bedside regarding discharge planning. Offered pt list of DME agencies to choose from. Pt chose [facility] to supply rollator. [name] of [facility] notified.  [name] RN, BSN, MSHL, CCM  Nurse - Case Manager,  (Unit [phone]  01/16/2014 Rollator ordered from [employer]

## 2014-01-16 NOTE — Progress Notes (Signed)
Because of her MI and heart failure after the heart catheterization, she will get a TOC appointment in one week.

## 2014-01-18 ENCOUNTER — Telehealth: Payer: Self-pay | Admitting: Internal Medicine

## 2014-01-21 NOTE — Telephone Encounter (Signed)
Closed enounter °

## 2014-01-22 ENCOUNTER — Ambulatory Visit (INDEPENDENT_AMBULATORY_CARE_PROVIDER_SITE_OTHER): Payer: Medicare Other | Admitting: Internal Medicine

## 2014-01-22 ENCOUNTER — Encounter: Payer: Self-pay | Admitting: Internal Medicine

## 2014-01-22 VITALS — BP 112/60 | HR 81 | Ht 62.0 in | Wt 154.7 lb

## 2014-01-22 DIAGNOSIS — Z955 Presence of coronary angioplasty implant and graft: Secondary | ICD-10-CM

## 2014-01-22 DIAGNOSIS — Z8679 Personal history of other diseases of the circulatory system: Secondary | ICD-10-CM

## 2014-01-22 DIAGNOSIS — I214 Non-ST elevation (NSTEMI) myocardial infarction: Secondary | ICD-10-CM

## 2014-01-22 DIAGNOSIS — E119 Type 2 diabetes mellitus without complications: Secondary | ICD-10-CM

## 2014-01-22 DIAGNOSIS — I5043 Acute on chronic combined systolic (congestive) and diastolic (congestive) heart failure: Secondary | ICD-10-CM

## 2014-01-22 NOTE — Patient Instructions (Signed)
Your physician wants you to follow-up in: 6 months with Dr. Debara Pickett. You will receive a reminder letter in the mail two months in advance. If you don't receive a letter, please call our office to schedule the follow-up appointment.  Please restart atorvastatin in about 1 week - call our office should your side effects continue.   Please schedule an appointment with her PCP  You have been referred to cardiac rehab at [facility]

## 2014-01-24 ENCOUNTER — Encounter: Payer: Self-pay | Admitting: Internal Medicine

## 2014-01-24 NOTE — Progress Notes (Signed)
OFFICE NOTE  Chief Complaint:  Hospital follow-up  Primary Care Physician: Merrilee Seashore, MD  HPI:  Anita Roberts is a 80 y.o. female with no previous history of CAD.she has a history of HTN, colon cancer, post op atrial fib s/p colon polyp removal and DM. She came to the hospital on 11/01 with epigastric pain and nausea. Her troponin was elevated and she was admitted with a non-STEMI, for further evaluation and treatment. Her symptoms were controlled with aspirin, heparin, nitrates, beta blocker and a statin. She was taken to the cath lab on 11/02, results are below. She had a drug-eluting stent to the circumflex, otherwise medical therapy. She tolerated the procedure well. After the procedure, she was noted to have some volume overload. A chest x-ray is blown showed heart failure. She was diuresed with IV Lasix and responded well to the diuretic. As she diuresed, her respiratory status improved. Hopefully, she will not have a chronic issue with volume overload. Currently, she is to track her weight but will not be on a standing diuretic dose. As she recovers from her MI, a diuretic may not be necessary. Her diabetes was controlled with sliding scale insulin. Hemoglobin A1c is elevated at 8.0. Dietary compliance with a low sodium diabetic diet is encouraged and she is to follow-up with primary care.  She follows up today with me in the office. She reports that she still gets some shortness of breath after walking. She also has some occasional swelling. She recently had an episode of diarrhea which she attributed to atorvastatin. She said she stopped the medicine and the diarrhea went away the next day. However she had been on the medicine for for 5 days in the hospital without side effects. It is unclear whether this is related to her cluster medicine however she is not interested in taking it at this time.  PMHx:  Past Medical History  Diagnosis Date  . Diabetes mellitus without  complication   . Hypertension   . Atrial fibrillation   . Non-Q wave ST elevation myocardial infarction (STEMI) involving left circumflex coronary artery 01/13/2013    2.75 x 22 mm Resolute DES tto the circumflex, otherwise medical therapy, EF 555% with wall motion abnormalities on echo     Past Surgical History  Procedure Laterality Date  . Coronary angioplasty with stent placement  111/04/2013    LAD 40%, diagonal 50%/99% (small vessel), circumflex 99%--> 0% with 2.75 x 22 mm Resolute DES, RCA 30%    FAMHx:  History reviewed. No pertinent family history.  SOCHx:   reports that she has never smoked. She does not have any smokeless tobacco history on file. She reports that she does not drink alcohol. Her drug history is not on file.  ALLERGIES:  Allergies  Allergen Reactions  . Coumadin [Warfarin]     Extreme bleeding  . Shellfish Allergy     Very alert.  Unable to sleep  . Versed [Midazolam] Nausea And Vomiting  . Aspirin Rash  . Penicillins Rash  . Sulfa Antibiotics Rash    ROS: A comprehensive review of systems was negative except for: Constitutional: positive for fatigue Gastrointestinal: positive for diarrhea  HOME MEDS: Current Outpatient Prescriptions  Medication Sig Dispense Refill  . citalopram (CELEXA) 20 MG tablet Take 20 mg by mouth daily.    . clopidogrel (PLAVIX) 75 MG tablet Take 1 tablet (75 mg total) by mouth daily with breakfast. 30 tablet 11  . diltiazem (DILACOR XR) 180 MG 24 hr  capsule Take 180 mg by mouth daily.    Marland Kitchen glipiZIDE (GLUCOTROL) 10 MG tablet Take 10 mg by mouth 2 (two) times daily before a meal.    . ILEVRO 0.3 % SUSP Place 1 drop into the left eye at bedtime.  1  . metoprolol succinate (TOPROL XL) 25 MG 24 hr tablet Take 1 tablet (25 mg total) by mouth daily. 30 tablet 11  . nitroGLYCERIN (NITROSTAT) 0.4 MG SL tablet Place 1 tablet (0.4 mg total) under the tongue every 5 (five) minutes as needed for chest pain. 25 tablet 3  . olmesartan  (BENICAR) 40 MG tablet Take 40 mg by mouth daily.    Marland Kitchen atorvastatin (LIPITOR) 80 MG tablet Take 1 tablet (80 mg total) by mouth daily at 6 PM. 30 tablet 11   No current facility-administered medications for this visit.    LABS/IMAGING: No results found for this or any previous visit (from the past 48 hour(s)). No results found.  VITALS: BP 112/60 mmHg  Pulse 81  Ht 5\' 2"  (1.575 m)  Wt 154 lb 11.2 oz (70.171 kg)  BMI 28.29 kg/m2  EXAM: General appearance: alert and no distress Neck: no carotid bruit and no JVD Lungs: clear to auscultation bilaterally Heart: regular rate and rhythm, S1, S2 normal, no murmur, click, rub or gallop Abdomen: soft, non-tender; bowel sounds normal; no masses,  no organomegaly Extremities: extremities normal, atraumatic, no cyanosis or edema Pulses: 2+ and symmetric Skin: Skin color, texture, turgor normal. No rashes or lesions Neurologic: Grossly normal PSych: Depressed mood  EKG: deferred  ASSESSMENT: 1. CAD status post PCI to the circumflex 2. Paroxysmal atrial fibrillation 3. Type 2 diabetes 4. Diarrhea  PLAN: 1.   [name] has improved in the fact that she has no further chest pain after PCI, however still reports some shortness of breath. She has diureses extensively and her weight is improved and is stable. She is currently not on diuretics. She recently discontinued her Lipitor because of diarrhea which she attributed to the medicine. It is not clear to me they are related however for now I would recommend continued to stay off of it. She does have a history of paroxysmal A. Fib, but as mentioned in the notes is not a good candidate for therapy with Plavix and warfarin. At this point we will treat with Plavix monotherapy. She does have aspirin allergy. I do think she has an element of deconditioning and would benefit from exercise in cardiac rehabilitation which we will refer her for. If she continues to be short of breath she may need to go  back on low-dose diuretic.  Pixie Casino, MD, [facility] Attending Cardiologist [facility]  Andersson Larrabee C 01/24/2014, 8:51 AM

## 2014-01-25 ENCOUNTER — Inpatient Hospital Stay (HOSPITAL_COMMUNITY)
Admission: EM | Admit: 2014-01-25 | Discharge: 2014-01-27 | DRG: 282 | Disposition: A | Discharge status: Home / Self Care | Payer: Medicare Other | Attending: Cardiology | Admitting: Cardiology

## 2014-01-25 ENCOUNTER — Encounter (HOSPITAL_COMMUNITY): Payer: Self-pay | Admitting: Emergency Medicine

## 2014-01-25 ENCOUNTER — Emergency Department (HOSPITAL_COMMUNITY): Payer: Medicare Other

## 2014-01-25 DIAGNOSIS — I5033 Acute on chronic diastolic (congestive) heart failure: Principal | ICD-10-CM

## 2014-01-25 DIAGNOSIS — Z955 Presence of coronary angioplasty implant and graft: Secondary | ICD-10-CM

## 2014-01-25 DIAGNOSIS — I229 Subsequent ST elevation (STEMI) myocardial infarction of unspecified site: Secondary | ICD-10-CM | POA: Diagnosis present

## 2014-01-25 DIAGNOSIS — Z886 Allergy status to analgesic agent status: Secondary | ICD-10-CM

## 2014-01-25 DIAGNOSIS — Z91013 Allergy to seafood: Secondary | ICD-10-CM

## 2014-01-25 DIAGNOSIS — C189 Malignant neoplasm of colon, unspecified: Secondary | ICD-10-CM | POA: Diagnosis present

## 2014-01-25 DIAGNOSIS — R110 Nausea: Secondary | ICD-10-CM | POA: Diagnosis not present

## 2014-01-25 DIAGNOSIS — R0902 Hypoxemia: Secondary | ICD-10-CM | POA: Diagnosis present

## 2014-01-25 DIAGNOSIS — I480 Paroxysmal atrial fibrillation: Secondary | ICD-10-CM

## 2014-01-25 DIAGNOSIS — Z880 Allergy status to penicillin: Secondary | ICD-10-CM

## 2014-01-25 DIAGNOSIS — I10 Essential (primary) hypertension: Secondary | ICD-10-CM | POA: Diagnosis present

## 2014-01-25 DIAGNOSIS — I2510 Atherosclerotic heart disease of native coronary artery without angina pectoris: Secondary | ICD-10-CM | POA: Diagnosis present

## 2014-01-25 DIAGNOSIS — R001 Bradycardia, unspecified: Secondary | ICD-10-CM | POA: Diagnosis present

## 2014-01-25 DIAGNOSIS — Z85038 Personal history of other malignant neoplasm of large intestine: Secondary | ICD-10-CM

## 2014-01-25 DIAGNOSIS — Z882 Allergy status to sulfonamides status: Secondary | ICD-10-CM

## 2014-01-25 DIAGNOSIS — Z86010 Personal history of colonic polyps: Secondary | ICD-10-CM

## 2014-01-25 DIAGNOSIS — I5023 Acute on chronic systolic (congestive) heart failure: Secondary | ICD-10-CM

## 2014-01-25 DIAGNOSIS — E119 Type 2 diabetes mellitus without complications: Secondary | ICD-10-CM

## 2014-01-25 HISTORY — DX: Bradycardia, unspecified: R00.1

## 2014-01-25 HISTORY — DX: Atherosclerotic heart disease of native coronary artery without angina pectoris: I25.10

## 2014-01-25 HISTORY — DX: Paroxysmal atrial fibrillation: I48.0

## 2014-01-25 HISTORY — DX: Chronic diastolic (congestive) heart failure: I50.32

## 2014-01-25 HISTORY — DX: Malignant neoplasm of colon, unspecified: C18.9

## 2014-01-25 LAB — I-STAT ARTERIAL BLOOD GAS, ED
Acid-Base Excess: 1 mmol/L (ref 0.0–2.0)
BICARBONATE: 27.6 meq/L — AB (ref 20.0–24.0)
O2 SAT: 97 %
Patient temperature: 97.5
TCO2: 29 mmol/L (ref 0–100)
pCO2 arterial: 47.9 mmHg — ABNORMAL HIGH (ref 35.0–45.0)
pH, Arterial: 7.366 (ref 7.350–7.450)
pO2, Arterial: 98 mmHg (ref 80.0–100.0)

## 2014-01-25 LAB — I-STAT TROPONIN, ED: TROPONIN I, POC: 0.02 ng/mL (ref 0.00–0.08)

## 2014-01-25 LAB — CBC WITH DIFFERENTIAL/PLATELET
Basophils Absolute: 0.1 10*3/uL (ref 0.0–0.1)
Basophils Relative: 1 % (ref 0–1)
EOS PCT: 2 % (ref 0–5)
Eosinophils Absolute: 0.2 10*3/uL (ref 0.0–0.7)
HEMATOCRIT: 40.6 % (ref 36.0–46.0)
Hemoglobin: 13.3 g/dL (ref 12.0–15.0)
LYMPHS ABS: 1.9 10*3/uL (ref 0.7–4.0)
LYMPHS PCT: 20 % (ref 12–46)
MCH: 29.2 pg (ref 26.0–34.0)
MCHC: 32.8 g/dL (ref 30.0–36.0)
MCV: 89 fL (ref 78.0–100.0)
MONO ABS: 0.5 10*3/uL (ref 0.1–1.0)
Monocytes Relative: 6 % (ref 3–12)
Neutro Abs: 6.5 10*3/uL (ref 1.7–7.7)
Neutrophils Relative %: 71 % (ref 43–77)
Platelets: 253 10*3/uL (ref 150–400)
RBC: 4.56 MIL/uL (ref 3.87–5.11)
RDW: 14.2 % (ref 11.5–15.5)
WBC: 9.1 10*3/uL (ref 4.0–10.5)

## 2014-01-25 LAB — COMPREHENSIVE METABOLIC PANEL
ALT: 21 U/L (ref 0–35)
AST: 18 U/L (ref 0–37)
Albumin: 3.8 g/dL (ref 3.5–5.2)
Alkaline Phosphatase: 81 U/L (ref 39–117)
Anion gap: 15 (ref 5–15)
BILIRUBIN TOTAL: 1 mg/dL (ref 0.3–1.2)
BUN: 13 mg/dL (ref 6–23)
CALCIUM: 9.1 mg/dL (ref 8.4–10.5)
CHLORIDE: 99 meq/L (ref 96–112)
CO2: 25 meq/L (ref 19–32)
CREATININE: 0.79 mg/dL (ref 0.50–1.10)
GFR calc non Af Amer: 72 mL/min — ABNORMAL LOW (ref >90)
GFR, EST AFRICAN AMERICAN: 84 mL/min — AB (ref >90)
GLUCOSE: 189 mg/dL — AB (ref 70–99)
Potassium: 4.2 mEq/L (ref 3.7–5.3)
Sodium: 139 mEq/L (ref 137–147)
Total Protein: 7.6 g/dL (ref 6.0–8.3)

## 2014-01-25 LAB — TROPONIN I: Troponin I: <0.3 ng/mL (ref <0.30)

## 2014-01-25 LAB — LIPASE, BLOOD: Lipase: 18 U/L (ref 11–59)

## 2014-01-25 LAB — GLUCOSE, CAPILLARY
GLUCOSE-CAPILLARY: 236 mg/dL — AB (ref 70–99)
Glucose-Capillary: 282 mg/dL — ABNORMAL HIGH (ref 70–99)

## 2014-01-25 LAB — PRO B NATRIURETIC PEPTIDE: Pro B Natriuretic peptide (BNP): 3093 pg/mL — ABNORMAL HIGH (ref 0–450)

## 2014-01-25 MED ORDER — IRBESARTAN 300 MG PO TABS
300.0000 mg | ORAL_TABLET | Freq: Every day | ORAL | Status: DC
  Administered 2014-01-25 – 2014-01-27 (×3): 300 mg via ORAL
  Filled 2014-01-25 (×3): qty 1

## 2014-01-25 MED ORDER — DILTIAZEM HCL ER 180 MG PO CP24
180.0000 mg | ORAL_CAPSULE | Freq: Every day | ORAL | Status: DC
  Administered 2014-01-25: 180 mg via ORAL
  Filled 2014-01-25 (×2): qty 1

## 2014-01-25 MED ORDER — CLOPIDOGREL BISULFATE 75 MG PO TABS
75.0000 mg | ORAL_TABLET | Freq: Every day | ORAL | Status: DC
  Administered 2014-01-26 – 2014-01-27 (×2): 75 mg via ORAL
  Filled 2014-01-25 (×4): qty 1

## 2014-01-25 MED ORDER — ACETAMINOPHEN 325 MG PO TABS: 650.0000 mg | ORAL_TABLET | ORAL | Status: DC | PRN

## 2014-01-25 MED ORDER — POTASSIUM CHLORIDE CRYS ER 20 MEQ PO TBCR
40.0000 meq | EXTENDED_RELEASE_TABLET | Freq: Once | ORAL | Status: AC
  Administered 2014-01-25: 40 meq via ORAL
  Filled 2014-01-25: qty 2

## 2014-01-25 MED ORDER — NITROGLYCERIN IN D5W 200-5 MCG/ML-% IV SOLN
10.0000 ug/min | INTRAVENOUS | Status: DC
Start: 2014-01-25 — End: 2014-01-26
  Administered 2014-01-25: 10 ug/min via INTRAVENOUS
  Filled 2014-01-25: qty 250

## 2014-01-25 MED ORDER — NITROGLYCERIN 0.4 MG SL SUBL: 0.4000 mg | SUBLINGUAL_TABLET | SUBLINGUAL | Status: DC | PRN

## 2014-01-25 MED ORDER — ONDANSETRON HCL 4 MG/2ML IJ SOLN: 4.0000 mg | Freq: Four times a day (QID) | INTRAMUSCULAR | Status: DC | PRN

## 2014-01-25 MED ORDER — IPRATROPIUM-ALBUTEROL 0.5-2.5 (3) MG/3ML IN SOLN
3.0000 mL | Freq: Once | RESPIRATORY_TRACT | Status: AC
  Administered 2014-01-25: 3 mL via RESPIRATORY_TRACT
  Filled 2014-01-25: qty 3

## 2014-01-25 MED ORDER — SODIUM CHLORIDE 0.9 % IJ SOLN: 3.0000 mL | INTRAMUSCULAR | Status: DC | PRN

## 2014-01-25 MED ORDER — SODIUM CHLORIDE 0.9 % IV SOLN: 250.0000 mL | INTRAVENOUS | Status: DC | PRN

## 2014-01-25 MED ORDER — SODIUM CHLORIDE 0.9 % IJ SOLN
3.0000 mL | Freq: Two times a day (BID) | INTRAMUSCULAR | Status: DC
  Administered 2014-01-25 – 2014-01-27 (×4): 3 mL via INTRAVENOUS

## 2014-01-25 MED ORDER — INSULIN ASPART 100 UNIT/ML ~~LOC~~ SOLN
0.0000 [IU] | Freq: Three times a day (TID) | SUBCUTANEOUS | Status: DC
  Administered 2014-01-26: 3 [IU] via SUBCUTANEOUS
  Administered 2014-01-26: 2 [IU] via SUBCUTANEOUS

## 2014-01-25 MED ORDER — METOPROLOL SUCCINATE ER 25 MG PO TB24
25.0000 mg | ORAL_TABLET | Freq: Every day | ORAL | Status: DC
  Administered 2014-01-25: 25 mg via ORAL
  Filled 2014-01-25 (×2): qty 1

## 2014-01-25 MED ORDER — HEPARIN SODIUM (PORCINE) 5000 UNIT/ML IJ SOLN
5000.0000 [IU] | Freq: Three times a day (TID) | INTRAMUSCULAR | Status: DC
  Administered 2014-01-25 – 2014-01-27 (×5): 5000 [IU] via SUBCUTANEOUS
  Filled 2014-01-25 (×8): qty 1

## 2014-01-25 MED ORDER — FUROSEMIDE 10 MG/ML IJ SOLN
40.0000 mg | Freq: Three times a day (TID) | INTRAMUSCULAR | Status: DC
  Administered 2014-01-25 – 2014-01-26 (×2): 40 mg via INTRAVENOUS
  Filled 2014-01-25 (×5): qty 4

## 2014-01-25 MED ORDER — FUROSEMIDE 10 MG/ML IJ SOLN
40.0000 mg | Freq: Once | INTRAMUSCULAR | Status: AC
  Administered 2014-01-25: 40 mg via INTRAVENOUS
  Filled 2014-01-25: qty 4

## 2014-01-25 MED ORDER — INSULIN ASPART 100 UNIT/ML ~~LOC~~ SOLN
0.0000 [IU] | Freq: Every day | SUBCUTANEOUS | Status: DC
  Administered 2014-01-26: 2 [IU] via SUBCUTANEOUS

## 2014-01-25 MED ORDER — ATORVASTATIN CALCIUM 80 MG PO TABS
80.0000 mg | ORAL_TABLET | Freq: Every day | ORAL | Status: DC
  Filled 2014-01-25 (×2): qty 1

## 2014-01-25 MED ORDER — CITALOPRAM HYDROBROMIDE 20 MG PO TABS
20.0000 mg | ORAL_TABLET | Freq: Every day | ORAL | Status: DC
  Administered 2014-01-25 – 2014-01-27 (×3): 20 mg via ORAL
  Filled 2014-01-25 (×3): qty 1

## 2014-01-25 MED ORDER — GLIPIZIDE 10 MG PO TABS
10.0000 mg | ORAL_TABLET | Freq: Two times a day (BID) | ORAL | Status: DC
  Administered 2014-01-26 – 2014-01-27 (×3): 10 mg via ORAL
  Filled 2014-01-25 (×6): qty 1

## 2014-01-25 NOTE — ED Notes (Signed)
Pt increasingly sob with pronounced wheexes bilat pt sitting 90 degrees upright O2 at 4 L [place] sat 94% Dr. Darl Householder informed and assessed pt orders received

## 2014-01-25 NOTE — ED Notes (Signed)
Attempted to call report

## 2014-01-25 NOTE — ED Notes (Signed)
resp easier, lungs diminished in bases, wheezing audible in upper airway, more throat area.

## 2014-01-25 NOTE — ED Provider Notes (Signed)
CSN: [id number]     Arrival date & time 01/25/14  8315 History   First MD Initiated Contact with Patient 01/25/14 0935     Chief Complaint  Patient presents with  . Nausea     (Consider location/radiation/quality/duration/timing/severity/associated sxs/prior Treatment) The history is provided by the patient and the EMS personnel. The history is limited by the condition of the patient.  Anita Roberts is a 80 y.o. female hx of DM, afib not on coumadin, recent MI s/p L circumflex stent here with nausea and shortness of breath. Nausea since yesterday. Some chest tightness. Also has persistent shortness of breath on exertion. Denies any fevers or chills or abdominal pain. She was recently admitted with NSTEMI and has left circumflex stent. Currently taking Plavix. She also had some CHF and is on Lasix. Saw cardiology yesterday for follow-up and she was having some shortness of breath after exertion that has been improving. Given zofran 4 mg by EMS.   Level V caveat- dementia   Past Medical History  Diagnosis Date  . Diabetes mellitus without complication   . Hypertension   . Atrial fibrillation   . Non-Q wave ST elevation myocardial infarction (STEMI) involving left circumflex coronary artery 01/13/2013    2.75 x 22 mm Resolute DES tto the circumflex, otherwise medical therapy, EF 555% with wall motion abnormalities on echo    Past Surgical History  Procedure Laterality Date  . Coronary angioplasty with stent placement  111/04/2013    LAD 40%, diagonal 50%/99% (small vessel), circumflex 99%--> 0% with 2.75 x 22 mm Resolute DES, RCA 30%   No family history on file. History  Substance Use Topics  . Smoking status: Never Smoker   . Smokeless tobacco: Not on file  . Alcohol Use: No   OB History    No data available     Review of Systems  Gastrointestinal: Positive for nausea.  All other systems reviewed and are negative.     Allergies  Coumadin; Shellfish allergy; Versed;  Aspirin; Penicillins; and Sulfa antibiotics  Home Medications   Prior to Admission medications   Medication Sig Start Date End Date Taking? Authorizing Provider  atorvastatin (LIPITOR) 80 MG tablet Take 1 tablet (80 mg total) by mouth daily at 6 PM. 01/16/14  Yes Rhonda G Barrett, PA-C  citalopram (CELEXA) 20 MG tablet Take 20 mg by mouth daily.   Yes Historical Provider, MD  clopidogrel (PLAVIX) 75 MG tablet Take 1 tablet (75 mg total) by mouth daily with breakfast. 01/16/14  Yes Rhonda G Barrett, PA-C  diltiazem (DILACOR XR) 180 MG 24 hr capsule Take 180 mg by mouth daily.   Yes Historical Provider, MD  glipiZIDE (GLUCOTROL) 10 MG tablet Take 10 mg by mouth 2 (two) times daily before a meal.   Yes Historical Provider, MD  metoprolol succinate (TOPROL XL) 25 MG 24 hr tablet Take 1 tablet (25 mg total) by mouth daily. 01/16/14  Yes Rhonda G Barrett, PA-C  olmesartan (BENICAR) 40 MG tablet Take 40 mg by mouth daily.   Yes Historical Provider, MD  nitroGLYCERIN (NITROSTAT) 0.4 MG SL tablet Place 1 tablet (0.4 mg total) under the tongue every 5 (five) minutes as needed for chest pain. 01/16/14   Rhonda G Barrett, PA-C   BP 147/67 mmHg  Pulse 78  Temp(Src) 97.5 F (36.4 C) (Oral)  Resp 23  Ht 5\' 2"  (1.575 m)  Wt 154 lb (69.854 kg)  BMI 28.16 kg/m2  SpO2 95% Physical Exam  Constitutional:  Chronically ill   HENT:  Head: Normocephalic.  Mouth/Throat: Oropharynx is clear and moist.  Eyes: Conjunctivae are normal. Pupils are equal, round, and reactive to light.  Neck: Normal range of motion. Neck supple.  Cardiovascular: Normal rate, regular rhythm and normal heart sounds.   Pulmonary/Chest: Effort normal. She exhibits no tenderness.  Crackles bilateral bases + JVD  Abdominal: Soft. Bowel sounds are normal. She exhibits no distension. There is no tenderness. There is no rebound and no guarding.  Musculoskeletal:  1+ edema bilateral legs   Neurological: She is alert. Coordination normal.   Skin: Skin is warm and dry.  Psychiatric: She has a normal mood and affect. Her behavior is normal. Judgment and thought content normal.  Nursing note and vitals reviewed.   ED Course  Procedures (including critical care time)  CRITICAL CARE Performed by: Darl Householder, DAVID   Total critical care time: 30 min   Critical care time was exclusive of separately billable procedures and treating other patients.  Critical care was necessary to treat or prevent imminent or life-threatening deterioration.  Critical care was time spent personally by me on the following activities: development of treatment plan with patient and/or surrogate as well as nursing, discussions with consultants, evaluation of patient's response to treatment, examination of patient, obtaining history from patient or surrogate, ordering and performing treatments and interventions, ordering and review of laboratory studies, ordering and review of radiographic studies, pulse oximetry and re-evaluation of patient's condition.   Labs Review Labs Reviewed  COMPREHENSIVE METABOLIC PANEL - Abnormal; Notable for the following:    Glucose, Bld 189 (*)    GFR calc non Af Amer 72 (*)    GFR calc Af Amer 84 (*)    All other components within normal limits  PRO B NATRIURETIC PEPTIDE - Abnormal; Notable for the following:    Pro B Natriuretic peptide (BNP) 3093.0 (*)    All other components within normal limits  I-STAT ARTERIAL BLOOD GAS, ED - Abnormal; Notable for the following:    pCO2 arterial 47.9 (*)    Bicarbonate 27.6 (*)    All other components within normal limits  CBC WITH DIFFERENTIAL  LIPASE, BLOOD  BLOOD GAS, ARTERIAL  I-STAT TROPOININ, ED    Imaging Review Dg Chest Port 1 View  01/25/2014   CLINICAL DATA:  80 year old with nausea  EXAM: PORTABLE CHEST - 1 VIEW  COMPARISON:  01/16/2014  FINDINGS: The cardiac silhouette is enlarged. The mediastinal contours are within normal limits. A few scattered atherosclerotic  aortic calcifications are present.  There is diffuse consolidation in the lung bases. The costophrenic angles are blunted. There is mild prominence of the interstitial and pulmonary vascular markings. There is no pneumothorax.  Degenerative changes of the spine are present.  IMPRESSION: 1. Cardiomegaly with mild to moderate interstitial pulmonary edema. 2. Small to moderate bilateral pleural effusions. 3. Bibasilar consolidation, compressive atelectasis versus pneumonitis. Clinical correlation is recommended.   Electronically Signed   By: Rosemarie Ax   On: 01/25/2014 10:49     EKG Interpretation None      MDM   Final diagnoses:  Nausea    Anita Roberts is a 80 y.o. female here with SOB, nausea. Hypoxic to 86% on RA. Likely CHF exacerbation vs unstable angina. Will place on O2, get BNP, labs, trop, CXR. Will need readmission.   11 AM BNP 3000. Given lasix. CXR showed pul edema.   12:10 PM More tachypneic now. Started on nitro drip for worsening pulm edema.  ABG reassuring. Don't need to start bipap.   1:13 PM Cardiology at bedside. Will admit.     Wandra Arthurs, MD 01/25/14 [phone]

## 2014-01-25 NOTE — H&P (Signed)
CARDIOLOGY ADMISSION NOTE   Patient ID: Anita Roberts MRN: [id number] DOB/AGE: [birth year] 80 y.o.  Admit date: 01/25/2014  Primary Physician   Merrilee Seashore, MD Primary Cardiologist   Dr. Debara Pickett Reason for Consultation   CHF  HPI: Anita Roberts is a 80 y.o. female with a history of HTN, colon cancer, post op atrial fib s/p colon polyp removal, DM and CAD s/p recent NSTEMI (01/14/14) s/p DES to Lcx c/b acute CHF post PCI who [facility] to [facility] ED today with SOB.   She presented to [facility] with nausea and her troponin was noted to be elevated. She was taken to the cath lab on 11/02 with placement of  DES to LCx. After the procedure, she was noted to have some volume overload and a  chest x-ray is showed acute CHF. She was diuresed with IV Lasix and her respiratory status improved. She was seen in the office yesterday by Dr. Debara Pickett where she reported some shortness of breath after walking and occasional swelling. He did not feel like she needed to be on maintenance diuretics at that time. This morning she woke up feeling acutely nauseated which was similar to her presentation with her NSTEMI last week and she called EMS. This has now resolved after zofran given en route to [facility] ED. When she arrived to the ED she became acutely SOB. She denies further nausea or chest pain. She cannot recall completely being SOB and is a poor historian. Family members in the room report that she was sitting straight up, panting and lost color in her face. She is now feeling better after IV Lasix 40mg  and NTG gtt in the ED. She denies palpitations but thinks she has had some orthopnea and PND.     Past Medical History  Diagnosis Date  . Diabetes mellitus without complication   . Hypertension   . Atrial fibrillation   . Non-Q wave ST elevation myocardial infarction (STEMI) involving left circumflex coronary artery 01/13/2013    2.75 x 22 mm Resolute DES tto the circumflex, otherwise medical therapy, EF 555% with  wall motion abnormalities on echo      Past Surgical History  Procedure Laterality Date  . Coronary angioplasty with stent placement  111/04/2013    LAD 40%, diagonal 50%/99% (small vessel), circumflex 99%--> 0% with 2.75 x 22 mm Resolute DES, RCA 30%    Allergies  Allergen Reactions  . Coumadin [Warfarin]     Extreme bleeding  . Shellfish Allergy     Very alert.  Unable to sleep  . Versed [Midazolam] Nausea And Vomiting  . Aspirin Rash  . Penicillins Rash  . Sulfa Antibiotics Rash    I have reviewed the patient's current medications   . nitroGLYCERIN 10 mcg/min (01/25/14 1149)     Prior to Admission medications   Medication Sig Start Date End Date Taking? Authorizing Provider  atorvastatin (LIPITOR) 80 MG tablet Take 1 tablet (80 mg total) by mouth daily at 6 PM. 01/16/14  Yes Rhonda G Barrett, PA-C  citalopram (CELEXA) 20 MG tablet Take 20 mg by mouth daily.   Yes Historical Provider, MD  clopidogrel (PLAVIX) 75 MG tablet Take 1 tablet (75 mg total) by mouth daily with breakfast. 01/16/14  Yes Rhonda G Barrett, PA-C  diltiazem (DILACOR XR) 180 MG 24 hr capsule Take 180 mg by mouth daily.   Yes Historical Provider, MD  glipiZIDE (GLUCOTROL) 10 MG tablet Take 10 mg by mouth 2 (two) times daily before  a meal.   Yes Historical Provider, MD  metoprolol succinate (TOPROL XL) 25 MG 24 hr tablet Take 1 tablet (25 mg total) by mouth daily. 01/16/14  Yes Rhonda G Barrett, PA-C  olmesartan (BENICAR) 40 MG tablet Take 40 mg by mouth daily.   Yes Historical Provider, MD  nitroGLYCERIN (NITROSTAT) 0.4 MG SL tablet Place 1 tablet (0.4 mg total) under the tongue every 5 (five) minutes as needed for chest pain. 01/16/14   Lonn Georgia, PA-C     History   Social History  . Marital Status: Widowed    Spouse Name: N/A    Number of Children: N/A  . Years of Education: N/A   Occupational History  . Not on file.   Social History Main Topics  . Smoking status: Never Smoker   . Smokeless  tobacco: Not on file  . Alcohol Use: No  . Drug Use: No  . Sexual Activity: Not on file   Other Topics Concern  . Not on file   Social History Narrative    No family status information on file.   No family history on file.   ROS:  Full 14 point review of systems complete and found to be negative unless listed above.  Physical Exam: Blood pressure 119/52, pulse 74, temperature 97.5 F (36.4 C), temperature source Oral, resp. rate 19, height 5\' 2"  (1.575 m), weight 154 lb (69.854 kg), SpO2 94 %.  General: Well developed, well nourished, female in no acute distress Head: Eyes PERRLA, No xanthomas.   Normocephalic and atraumatic, oropharynx without edema or exudate.   Lungs: crackles at bases bilaterally. Heart: HRRR S1 S2, no rub/gallop, Heart irregular rate and rhythm with S1, S2  murmur. pulses are 2+ extrem.   Neck: No carotid bruits. No lymphadenopathy. JVP 12 cm. Abdomen: Bowel sounds present, abdomen soft and non-tender without masses or hernias noted. Mild distension Msk:  No spine or cva tenderness. No weakness, no joint deformities or effusions. Extremities: No clubbing or cyanosis.  No edema.  Neuro: Alert and oriented X 3. No focal deficits noted. Psych:  Good affect, responds appropriately Skin: No rashes or lesions noted.  Labs:   Lab Results  Component Value Date   WBC 9.1 01/25/2014   HGB 13.3 01/25/2014   HCT 40.6 01/25/2014   MCV 89.0 01/25/2014   PLT 253 01/25/2014   No results for input(s): INR in the last 72 hours.   Recent Labs Lab 01/25/14 1019  NA 139  K 4.2  CL 99  CO2 25  BUN 13  CREATININE 0.79  CALCIUM 9.1  PROT 7.6  BILITOT 1.0  ALKPHOS 81  ALT 21  AST 18  GLUCOSE 189*  ALBUMIN 3.8    Recent Labs  01/25/14 1030  TROPIPOC 0.02   PRO B NATRIURETIC PEPTIDE (BNP)  Date/Time Value Ref Range Status  01/25/2014 10:19 AM 3093.0* 0 - 450 pg/mL Final  01/16/2014 03:33 AM 2378.0* 0 - 450 pg/mL Final   Lab Results  Component Value  Date   CHOL 147 01/14/2014   HDL 53 01/14/2014   LDLCALC 81 01/14/2014   TRIG 65 01/14/2014    LIPASE  Date/Time Value Ref Range Status  01/25/2014 10:19 AM 18 11 - 59 U/L Final   TSH  Date/Time Value Ref Range Status  01/13/2014 06:15 PM 4.190 0.350 - 4.500 uIU/mL Final     Echo: Study Date: 01/14/2014 LV EF: 50% -  55% Study Conclusions - Left ventricle: The cavity size  was normal. Wall thickness was normal. Systolic function was normal. The estimated ejection fraction was in the range of 50% to 55%. Hypokinesis of the mid-apicalanterolateral and inferolateral myocardium; consistent with infarction in the distribution of the left circumflex coronary artery. - Aortic valve: There was mild to moderate regurgitation directed centrally in the LVOT. - Mitral valve: Calcified annulus. There was moderate regurgitation directed centrally. - Left atrium: The atrium was moderately to severely dilated. - Right atrium: The atrium was moderately to severely dilated. - Tricuspid valve: There was moderate regurgitation. - Pulmonary arteries: Systolic pressure was moderately increased. PA peak pressure: 56 mm Hg (S). - Pericardium, extracardiac: A trivial pericardial effusion was identified. There was a left pleural effusion. Impressions: - Compared to 2006 there is a new wall motion abnormality.  ECG:  Not in epic   Radiology:  Dg Chest Port 1 View  01/25/2014   CLINICAL DATA:  80 year old with nausea  EXAM: PORTABLE CHEST - 1 VIEW  COMPARISON:  01/16/2014  FINDINGS: The cardiac silhouette is enlarged. The mediastinal contours are within normal limits. A few scattered atherosclerotic aortic calcifications are present.  There is diffuse consolidation in the lung bases. The costophrenic angles are blunted. There is mild prominence of the interstitial and pulmonary vascular markings. There is no pneumothorax.  Degenerative changes of the spine are present.  IMPRESSION:  1. Cardiomegaly with mild to moderate interstitial pulmonary edema. 2. Small to moderate bilateral pleural effusions. 3. Bibasilar consolidation, compressive atelectasis versus pneumonitis. Clinical correlation is recommended.   Electronically Signed   By: Rosemarie Ax   On: 01/25/2014 10:49    ASSESSMENT AND PLAN:    Active Problems:   History of atrial fibrillation   DM II (diabetes mellitus, type II), controlled   Acute on chronic combined systolic and diastolic congestive heart failure, NYHA class 3   S/P coronary artery stent placement  Preethi S Slovacek is a 80 y.o. female with a history of HTN, colon cancer, post op atrial fib s/p colon polyp removal, DM and CAD s/p recent NSTEMI (01/14/14) s/p DES to Lcx c/b acute CHF post PCI who presents to [facility] ED today with SOB.   Acute on chronic diastolic CHF-  -- 2D ECHO on 01/14/14 with EF 50-55% and hypokinesis of the mid-apicalanterolateral and inferolateral myocardium; consistent with infarction in the distribution of the LCx coronary artery. Mild-mod AR directed centrally in LVOT, mild MR, mod-sev LA dilation, mod-sev RA dilation, PA pk pressure 56 -- BNP 3K: CXR w/ cardiomegaly with mild to moderate interstitial pulmonary edema, small to moderate bilateral pleural effusions, bibasilar consolidation, compressive atelectasis versus pneumonitis. -- Will start IV Lasix 40mg  q8hrs. She will need to go home on maintainence dose diuretics. Can remain on NTG gtt for now.  CAD- recent NSTEMI s/p DES to LCx -- She did have some nausea this AM which was the presenting sx with her acute NSTEMI.  -- POC troponin neg. Continue to cycle cardiac markers.  -- Continue plavix, no asa due to allergy. Continue statin and BB   PAF- occurred once post operatively after colon cancer removal ~2006 -- This has been quiescent on diltiazem  -- Was on Coumadin for a short time but did have some bleeding and this was discontinued  -- Tele noted to be in NSR with freq  PACs, continue to monitor  HTN- continue home Benicar 40, diltiazem CD 180mg  and Torprol XL 25mg  qd  DM- Hg A1c 8 -- SSI  -- Continue SSI  Signed:  Eileen Stanford, PA-C 01/25/2014 1:04 PM  Pager [phone]  Co-Sign MD  Patient seen with PA, agree with the above note.   1. Acute on chronic diastolic CHF: Patient is volume overloaded on exam.  Pulmonary edema on CXR.  She had CHF in the hospital, did not go home on Lasix.  Suspect this is a gradual accumulation.  - Lasix 40 mg IV every 8 hrs + KCl 40 daily.  Follow I/Os.  Hopefully only a couple days in hospital needed.  2. CAD: Recent NSTEMI, s/p DES to LCx.  No chest pain, troponin normal.  Doubt ACS but will cycle troponin. Continue Plavix.  She is allergic to ASA.  3. PAF: Noted after an operation years ago.  She is in NSR and is not on anticoagulation.   Loralie Champagne 01/25/2014 1:46 PM

## 2014-01-25 NOTE — H&P (Deleted)
CARDIOLOGY ADMISSION NOTE   Patient ID: [name] MRN: [id number] DOB/AGE: 08-28-1923 80 y.o.  Admit date: 01/25/2014  Primary Physician   Merrilee Seashore, MD Primary Cardiologist   Dr. Debara Pickett Reason for Consultation   CHF  HPI: Anita Roberts is a 80 y.o. female with a history of HTN, colon cancer, post op atrial fib s/p colon polyp removal, DM and CAD s/p recent NSTEMI (01/14/14) s/p DES to Lcx c/b acute CHF post PCI who presents to [facility] ED today with SOB.   She was taken to the cath lab on 11/02, results are below. She had a drug-eluting stent to the circumflex, otherwise medical therapy. She tolerated the procedure well. After the procedure, she was noted to have some volume overload. A chest x-ray is blown showed heart failure. She was diuresed with IV Lasix and responded well to the diuretic. As she diuresed, her respiratory status improved.  She was seen in the office yesterday by Dr. Debara Pickett where she reported some shortness of breath after walking and occasional swelling. She recently had an episode of diarrhea which she attributed to atorvastatin. She said she stopped the medicine and the diarrhea went away the next day. However she had been on the medicine for for 5 days in the hospital without side effects. It is unclear whether this is related to her cluster medicine however she is not interested in taking it at this time.  Past Medical History  Diagnosis Date  . Diabetes mellitus without complication   . Hypertension   . Atrial fibrillation   . Non-Q wave ST elevation myocardial infarction (STEMI) involving left circumflex coronary artery 01/13/2013    2.75 x 22 mm Resolute DES tto the circumflex, otherwise medical therapy, EF 555% with wall motion abnormalities on echo      Past Surgical History  Procedure Laterality Date  . Coronary angioplasty with stent placement  111/04/2013    LAD 40%, diagonal 50%/99% (small vessel), circumflex 99%--> 0% with 2.75 x 22 mm  Resolute DES, RCA 30%    Allergies  Allergen Reactions  . Coumadin [Warfarin]     Extreme bleeding  . Shellfish Allergy     Very alert.  Unable to sleep  . Versed [Midazolam] Nausea And Vomiting  . Aspirin Rash  . Penicillins Rash  . Sulfa Antibiotics Rash    I have reviewed the patient's current medications   . nitroGLYCERIN 10 mcg/min (01/25/14 1149)     Prior to Admission medications   Medication Sig Start Date End Date Taking? Authorizing Provider  atorvastatin (LIPITOR) 80 MG tablet Take 1 tablet (80 mg total) by mouth daily at 6 PM. 01/16/14  Yes Rhonda G Barrett, PA-C  citalopram (CELEXA) 20 MG tablet Take 20 mg by mouth daily.   Yes Historical Provider, MD  clopidogrel (PLAVIX) 75 MG tablet Take 1 tablet (75 mg total) by mouth daily with breakfast. 01/16/14  Yes Rhonda G Barrett, PA-C  diltiazem (DILACOR XR) 180 MG 24 hr capsule Take 180 mg by mouth daily.   Yes Historical Provider, MD  glipiZIDE (GLUCOTROL) 10 MG tablet Take 10 mg by mouth 2 (two) times daily before a meal.   Yes Historical Provider, MD  metoprolol succinate (TOPROL XL) 25 MG 24 hr tablet Take 1 tablet (25 mg total) by mouth daily. 01/16/14  Yes Rhonda G Barrett, PA-C  olmesartan (BENICAR) 40 MG tablet Take 40 mg by mouth daily.   Yes Historical Provider, MD  nitroGLYCERIN (NITROSTAT) 0.4  MG SL tablet Place 1 tablet (0.4 mg total) under the tongue every 5 (five) minutes as needed for chest pain. 01/16/14   Lonn Georgia, PA-C     History   Social History  . Marital Status: Widowed    Spouse Name: N/A    Number of Children: N/A  . Years of Education: N/A   Occupational History  . Not on file.   Social History Main Topics  . Smoking status: Never Smoker   . Smokeless tobacco: Not on file  . Alcohol Use: No  . Drug Use: No  . Sexual Activity: Not on file   Other Topics Concern  . Not on file   Social History Narrative    No family status information on file.   No family history on file.     ROS:  Full 14 point review of systems complete and found to be negative unless listed above.  Physical Exam: Blood pressure 119/52, pulse 74, temperature 97.5 F (36.4 C), temperature source Oral, resp. rate 19, height 5\' 2"  (1.575 m), weight 154 lb (69.854 kg), SpO2 94 %.  General: Well developed, well nourished, female in no acute distress Head: Eyes PERRLA, No xanthomas.   Normocephalic and atraumatic, oropharynx without edema or exudate. Dentition:  Lungs:  Heart: HRRR S1 S2, no rub/gallop, Heart irregular rate and rhythm with S1, S2  murmur. pulses are 2+ extrem.   Neck: No carotid bruits. No lymphadenopathy.  JVD. Abdomen: Bowel sounds present, abdomen soft and non-tender without masses or hernias noted. Msk:  No spine or cva tenderness. No weakness, no joint deformities or effusions. Extremities: No clubbing or cyanosis.  edema.  Neuro: Alert and oriented X 3. No focal deficits noted. Psych:  Good affect, responds appropriately Skin: No rashes or lesions noted.  Labs:   Lab Results  Component Value Date   WBC 9.1 01/25/2014   HGB 13.3 01/25/2014   HCT 40.6 01/25/2014   MCV 89.0 01/25/2014   PLT 253 01/25/2014   No results for input(s): INR in the last 72 hours.   Recent Labs Lab 01/25/14 1019  NA 139  K 4.2  CL 99  CO2 25  BUN 13  CREATININE 0.79  CALCIUM 9.1  PROT 7.6  BILITOT 1.0  ALKPHOS 81  ALT 21  AST 18  GLUCOSE 189*  ALBUMIN 3.8   No results found for: MG No results for input(s): CKTOTAL, CKMB, TROPONINI in the last 72 hours.  Recent Labs  01/25/14 1030  TROPIPOC 0.02   PRO B NATRIURETIC PEPTIDE (BNP)  Date/Time Value Ref Range Status  01/25/2014 10:19 AM 3093.0* 0 - 450 pg/mL Final  01/16/2014 03:33 AM 2378.0* 0 - 450 pg/mL Final   Lab Results  Component Value Date   CHOL 147 01/14/2014   HDL 53 01/14/2014   LDLCALC 81 01/14/2014   TRIG 65 01/14/2014   No results found for: DDIMER LIPASE  Date/Time Value Ref Range Status   01/25/2014 10:19 AM 18 11 - 59 U/L Final   TSH  Date/Time Value Ref Range Status  01/13/2014 06:15 PM 4.190 0.350 - 4.500 uIU/mL Final   No results found for: VITAMINB12, FOLATE, FERRITIN, TIBC, IRON, RETICCTPCT  Echo:   ECG:    Radiology:  Dg Chest Port 1 View  01/25/2014   CLINICAL DATA:  80 year old with nausea  EXAM: PORTABLE CHEST - 1 VIEW  COMPARISON:  01/16/2014  FINDINGS: The cardiac silhouette is enlarged. The mediastinal contours are within normal limits. A  few scattered atherosclerotic aortic calcifications are present.  There is diffuse consolidation in the lung bases. The costophrenic angles are blunted. There is mild prominence of the interstitial and pulmonary vascular markings. There is no pneumothorax.  Degenerative changes of the spine are present.  IMPRESSION: 1. Cardiomegaly with mild to moderate interstitial pulmonary edema. 2. Small to moderate bilateral pleural effusions. 3. Bibasilar consolidation, compressive atelectasis versus pneumonitis. Clinical correlation is recommended.   Electronically Signed   By: Rosemarie Ax   On: 01/25/2014 10:49    ASSESSMENT AND PLAN:    Active Problems:   History of atrial fibrillation   DM II (diabetes mellitus, type II), controlled   Acute on chronic combined systolic and diastolic congestive heart failure, NYHA class 3   S/P coronary artery stent placement  Acute on chronic diastolic CHF-  -- 2D ECHO on 01/14/14 with EF 50-55% and hypokinesis of the mid-apicalanterolateral and inferolateral myocardium; consistent with infarction in the distribution of the left circumflex coronary artery. -- BNP 3K: CXR w/ cardiomegaly with mild to moderate interstitial pulmonary edema, small to moderate bilateral pleural effusions, bibasilar consolidation, compressive atelectasis versus pneumonitis.  PAF- occurred once post operatively after colon cancer removal ~2006 -- This has been quiescent on diltiazem  -- Was on Coumadin for a short  time but did have some bleeding and this was discontinued  -- Tele noted to be in NSR with freq PACs, however, she is at high risk to go back into afib.   HTN- continue home Benicar 40, diltiazem CD 180mg  and lopressor 12.5mg  BID  DM- Hg A1c 8 -- Continue SSI    SignedEileen Stanford, PA-C 01/25/2014 12:48 PM  Pager [phone]  Co-Sign MD

## 2014-01-25 NOTE — ED Notes (Signed)
Pt eating applesauce and graham crackers

## 2014-01-25 NOTE — ED Notes (Signed)
Per EMS, patient had an episode of nausea starting at 0700 today.   She received zofran on the way in and states "I still have a little bit, but it's better".  Patient denies any SOB, denies chest pain, denies vomiting and diarrhea.

## 2014-01-26 DIAGNOSIS — I229 Subsequent ST elevation (STEMI) myocardial infarction of unspecified site: Secondary | ICD-10-CM | POA: Diagnosis present

## 2014-01-26 DIAGNOSIS — Z91013 Allergy to seafood: Secondary | ICD-10-CM | POA: Diagnosis not present

## 2014-01-26 DIAGNOSIS — Z85038 Personal history of other malignant neoplasm of large intestine: Secondary | ICD-10-CM | POA: Diagnosis not present

## 2014-01-26 DIAGNOSIS — Z882 Allergy status to sulfonamides status: Secondary | ICD-10-CM | POA: Diagnosis not present

## 2014-01-26 DIAGNOSIS — Z886 Allergy status to analgesic agent status: Secondary | ICD-10-CM | POA: Diagnosis not present

## 2014-01-26 DIAGNOSIS — I2510 Atherosclerotic heart disease of native coronary artery without angina pectoris: Secondary | ICD-10-CM | POA: Diagnosis present

## 2014-01-26 DIAGNOSIS — R110 Nausea: Secondary | ICD-10-CM | POA: Diagnosis present

## 2014-01-26 DIAGNOSIS — R0902 Hypoxemia: Secondary | ICD-10-CM | POA: Diagnosis present

## 2014-01-26 DIAGNOSIS — I5033 Acute on chronic diastolic (congestive) heart failure: Secondary | ICD-10-CM | POA: Diagnosis present

## 2014-01-26 DIAGNOSIS — R001 Bradycardia, unspecified: Secondary | ICD-10-CM | POA: Diagnosis present

## 2014-01-26 DIAGNOSIS — Z86010 Personal history of colonic polyps: Secondary | ICD-10-CM | POA: Diagnosis not present

## 2014-01-26 DIAGNOSIS — Z955 Presence of coronary angioplasty implant and graft: Secondary | ICD-10-CM | POA: Diagnosis not present

## 2014-01-26 DIAGNOSIS — I480 Paroxysmal atrial fibrillation: Secondary | ICD-10-CM | POA: Diagnosis present

## 2014-01-26 DIAGNOSIS — E119 Type 2 diabetes mellitus without complications: Secondary | ICD-10-CM | POA: Diagnosis present

## 2014-01-26 DIAGNOSIS — Z880 Allergy status to penicillin: Secondary | ICD-10-CM | POA: Diagnosis not present

## 2014-01-26 DIAGNOSIS — I10 Essential (primary) hypertension: Secondary | ICD-10-CM | POA: Diagnosis present

## 2014-01-26 LAB — GLUCOSE, CAPILLARY
GLUCOSE-CAPILLARY: 164 mg/dL — AB (ref 70–99)
Glucose-Capillary: 92 mg/dL (ref 70–99)
Glucose-Capillary: 237 mg/dL — ABNORMAL HIGH (ref 70–99)
Glucose-Capillary: 161 mg/dL — ABNORMAL HIGH (ref 70–99)

## 2014-01-26 LAB — BASIC METABOLIC PANEL
ANION GAP: 11 (ref 5–15)
BUN: 12 mg/dL (ref 6–23)
CHLORIDE: 100 meq/L (ref 96–112)
CO2: 27 mEq/L (ref 19–32)
CREATININE: 0.91 mg/dL (ref 0.50–1.10)
Calcium: 8.3 mg/dL — ABNORMAL LOW (ref 8.4–10.5)
GFR, EST AFRICAN AMERICAN: 63 mL/min — AB (ref >90)
GFR, EST NON AFRICAN AMERICAN: 55 mL/min — AB (ref >90)
Glucose, Bld: 98 mg/dL (ref 70–99)
Potassium: 4.3 mEq/L (ref 3.7–5.3)
Sodium: 138 mEq/L (ref 137–147)

## 2014-01-26 LAB — TROPONIN I: Troponin I: <0.3 ng/mL (ref <0.30)

## 2014-01-26 MED ORDER — FUROSEMIDE 10 MG/ML IJ SOLN
40.0000 mg | Freq: Once | INTRAMUSCULAR | Status: AC
  Administered 2014-01-26: 40 mg via INTRAVENOUS

## 2014-01-26 MED ORDER — METOPROLOL SUCCINATE ER 25 MG PO TB24
25.0000 mg | ORAL_TABLET | Freq: Every day | ORAL | Status: DC
  Administered 2014-01-27: 25 mg via ORAL
  Filled 2014-01-26: qty 1

## 2014-01-26 NOTE — Progress Notes (Signed)
SUBJECTIVE: The patient is doing well today.  SOB is "90%" better.   At this time, she denies chest pain or any new concerns.  Marland Kitchen atorvastatin  80 mg Oral q1800  . citalopram  20 mg Oral Daily  . clopidogrel  75 mg Oral Q breakfast  . furosemide  40 mg Intravenous Once  . glipiZIDE  10 mg Oral BID AC  . heparin  5,000 Units Subcutaneous 3 times per day  . insulin aspart  0-5 Units Subcutaneous QHS  . insulin aspart  0-9 Units Subcutaneous TID WC  . irbesartan  300 mg Oral Daily  . [START ON 01/27/2014] metoprolol succinate  25 mg Oral Daily  . sodium chloride  3 mL Intravenous Q12H      OBJECTIVE: Physical Exam: Filed Vitals:   01/25/14 2233 01/26/14 0141 01/26/14 0548 01/26/14 1054  BP: 128/62 128/68 107/51 106/45  Pulse:  75 51 48  Temp:  97.3 F (36.3 C) 97.6 F (36.4 C)   TempSrc:  Oral Oral   Resp:  18 18   Height:      Weight:   149 lb 14.3 oz (67.991 kg)   SpO2:  99% 97%     Intake/Output Summary (Last 24 hours) at 01/26/14 1103 Last data filed at 01/26/14 0957  Gross per 24 hour  Intake    840 ml  Output   2852 ml  Net  -2012 ml    Telemetry reveals sinus rhythm  GEN- The patient is elderly appearing, alert and oriented x 3 today.   Head- normocephalic, atraumatic Eyes-  Sclera clear, conjunctiva pink Ears- hearing intact Oropharynx- clear Neck- supple, JVP 9cm Lungs- bibasilar rales, normal work of breathing Heart- Regular rate and rhythm  GI- soft, NT, ND, + BS Extremities- no clubbing, cyanosis, or edema Skin- no rash or lesion Psych- euthymic mood, full affect Neuro- strength and sensation are intact  LABS: Basic Metabolic Panel:  Recent Labs  01/25/14 1019 01/26/14 0720  NA 139 138  K 4.2 4.3  CL 99 100  CO2 25 27  GLUCOSE 189* 98  BUN 13 12  CREATININE 0.79 0.91  CALCIUM 9.1 8.3*   Liver Function Tests:  Recent Labs  01/25/14 1019  AST 18  ALT 21  ALKPHOS 81  BILITOT 1.0  PROT 7.6  ALBUMIN 3.8    Recent Labs  01/25/14 1019  LIPASE 18   CBC:  Recent Labs  01/25/14 1019  WBC 9.1  NEUTROABS 6.5  HGB 13.3  HCT 40.6  MCV 89.0  PLT 253   Cardiac Enzymes:  Recent Labs  01/25/14 1928 01/26/14 0109 01/26/14 0720  TROPONINI <0.30 <0.30 <0.30   RADIOLOGY: Dg Chest 2 View  01/16/2014   CLINICAL DATA:  Congestive heart failure. An known pleural effusions  EXAM: CHEST  2 VIEW  COMPARISON:  Portable chest x-ray of January 23, 2014.  FINDINGS: The lungs are better inflated today. The interstitial markings are slightly less prominent. There is bibasilar atelectasis. There are stable bilateral pleural effusions slightly greater on the left than on the right. The cardiac silhouette remains mildly enlarged. The pulmonary vascularity is prominent centrally. The bony thorax is unremarkable.  IMPRESSION: There has been slight interval improvement in the aeration of both lungs. CHF with stable small to moderate-sized bilateral pleural effusions.   Electronically Signed   By: David  Martinique   On: 01/16/2014 08:15   Dg Chest Port 1 View  01/25/2014   CLINICAL DATA:  80 year old with  nausea  EXAM: PORTABLE CHEST - 1 VIEW  COMPARISON:  01/16/2014  FINDINGS: The cardiac silhouette is enlarged. The mediastinal contours are within normal limits. A few scattered atherosclerotic aortic calcifications are present.  There is diffuse consolidation in the lung bases. The costophrenic angles are blunted. There is mild prominence of the interstitial and pulmonary vascular markings. There is no pneumothorax.  Degenerative changes of the spine are present.  IMPRESSION: 1. Cardiomegaly with mild to moderate interstitial pulmonary edema. 2. Small to moderate bilateral pleural effusions. 3. Bibasilar consolidation, compressive atelectasis versus pneumonitis. Clinical correlation is recommended.   Electronically Signed   By: Rosemarie Ax   On: 01/25/2014 10:49   Dg Chest Portable 1 View  01/13/2014   CLINICAL DATA:  Epigastric  pain and nausea  EXAM: PORTABLE CHEST - 1 VIEW  COMPARISON:  None  FINDINGS: Moderate cardiomegaly. Vascular congestion. Bilateral pleural effusions and bibasilar airspace edema. No pneumothorax.  IMPRESSION: CHF with bilateral effusions and pulmonary edema.   Electronically Signed   By: Maryclare Bean M.D.   On: 01/13/2014 12:21    ASSESSMENT AND PLAN:  Active Problems:   History of atrial fibrillation   DM II (diabetes mellitus, type II), controlled   Acute on chronic combined systolic and diastolic congestive heart failure, NYHA class 3   S/P coronary artery stent placement   SOB (shortness of breath)  1. Acute on chronic diastolic dysfunction Improving with diuresis Will plan to convert to oral lasix tomorrow Stop diltiazem and nitro gtt  2. CAD No ischemic symptoms Continue current medicines  3. Remote afib Stop diltiazem  4. Asymptomatic bradycardia Hold diltiazem  Wean O2 as above [facility] to discharge tomorrow if she continues to improve   Thompson Grayer, MD 01/26/2014 11:03 AM

## 2014-01-26 NOTE — Progress Notes (Signed)
UR completed 

## 2014-01-26 NOTE — Progress Notes (Signed)
The patient's HR dropped into the 40s overnight.  She does not have any complains of chest pain this morning and her blood pressure remained within normal limits overnight.

## 2014-01-27 ENCOUNTER — Encounter (HOSPITAL_COMMUNITY): Payer: Self-pay | Admitting: Physician Assistant

## 2014-01-27 DIAGNOSIS — R001 Bradycardia, unspecified: Secondary | ICD-10-CM | POA: Diagnosis present

## 2014-01-27 DIAGNOSIS — R0602 Shortness of breath: Secondary | ICD-10-CM

## 2014-01-27 DIAGNOSIS — I5033 Acute on chronic diastolic (congestive) heart failure: Secondary | ICD-10-CM

## 2014-01-27 DIAGNOSIS — I10 Essential (primary) hypertension: Secondary | ICD-10-CM | POA: Diagnosis present

## 2014-01-27 DIAGNOSIS — E119 Type 2 diabetes mellitus without complications: Secondary | ICD-10-CM

## 2014-01-27 DIAGNOSIS — C189 Malignant neoplasm of colon, unspecified: Secondary | ICD-10-CM | POA: Diagnosis present

## 2014-01-27 DIAGNOSIS — I5043 Acute on chronic combined systolic (congestive) and diastolic (congestive) heart failure: Secondary | ICD-10-CM

## 2014-01-27 DIAGNOSIS — I2510 Atherosclerotic heart disease of native coronary artery without angina pectoris: Secondary | ICD-10-CM | POA: Diagnosis present

## 2014-01-27 LAB — BASIC METABOLIC PANEL
ANION GAP: 9 (ref 5–15)
BUN: 12 mg/dL (ref 6–23)
CO2: 32 meq/L (ref 19–32)
CREATININE: 1.05 mg/dL (ref 0.50–1.10)
Calcium: 8.6 mg/dL (ref 8.4–10.5)
Chloride: 99 mEq/L (ref 96–112)
GFR calc Af Amer: 53 mL/min — ABNORMAL LOW (ref >90)
GFR calc non Af Amer: 46 mL/min — ABNORMAL LOW (ref >90)
Glucose, Bld: 77 mg/dL (ref 70–99)
POTASSIUM: 4 meq/L (ref 3.7–5.3)
Sodium: 140 mEq/L (ref 137–147)

## 2014-01-27 LAB — GLUCOSE, CAPILLARY: GLUCOSE-CAPILLARY: 81 mg/dL (ref 70–99)

## 2014-01-27 MED ORDER — FUROSEMIDE 40 MG PO TABS
40.0000 mg | ORAL_TABLET | Freq: Two times a day (BID) | ORAL | Status: DC
Start: 2014-01-27 — End: 2014-04-22

## 2014-01-27 MED ORDER — FUROSEMIDE 40 MG PO TABS
40.0000 mg | ORAL_TABLET | Freq: Two times a day (BID) | ORAL | Status: DC
  Administered 2014-01-27: 40 mg via ORAL
  Filled 2014-01-27 (×3): qty 1

## 2014-01-27 NOTE — Discharge Instructions (Signed)
Please take Lasix 40mg  ( 1 tablet) twice a day for 3 days. Then continue taking 40mg  ( 1 tablet) one time daily.      Heart Failure Heart failure is a condition in which the heart has trouble pumping blood. This means your heart does not pump blood efficiently for your body to work well. In some cases of heart failure, fluid may back up into your lungs or you may have swelling (edema) in your lower legs. Heart failure is usually a long-term (chronic) condition. It is important for you to take good care of yourself and follow your health care provider's treatment plan. CAUSES  Some health conditions can cause heart failure. Those health conditions include:  High blood pressure (hypertension). Hypertension causes the heart muscle to work harder than normal. When pressure in the blood vessels is high, the heart needs to pump (contract) with more force in order to circulate blood throughout the body. High blood pressure eventually causes the heart to become stiff and weak.  Coronary artery disease (CAD). CAD is the buildup of cholesterol and fat (plaque) in the arteries of the heart. The blockage in the arteries deprives the heart muscle of oxygen and blood. This can cause chest pain and may lead to a heart attack. High blood pressure can also contribute to CAD.  Heart attack (myocardial infarction). A heart attack occurs when one or more arteries in the heart become blocked. The loss of oxygen damages the muscle tissue of the heart. When this happens, part of the heart muscle dies. The injured tissue does not contract as well and weakens the heart's ability to pump blood.  Abnormal heart valves. When the heart valves do not open and close properly, it can cause heart failure. This makes the heart muscle pump harder to keep the blood flowing.  Heart muscle disease (cardiomyopathy or myocarditis). Heart muscle disease is damage to the heart muscle from a variety of causes. These can include drug or  alcohol abuse, infections, or unknown reasons. These can increase the risk of heart failure.  Lung disease. Lung disease makes the heart work harder because the lungs do not work properly. This can cause a strain on the heart, leading it to fail.  Diabetes. Diabetes increases the risk of heart failure. High blood sugar contributes to high fat (lipid) levels in the blood. Diabetes can also cause slow damage to tiny blood vessels that carry important nutrients to the heart muscle. When the heart does not get enough oxygen and food, it can cause the heart to become weak and stiff. This leads to a heart that does not contract efficiently.  Other conditions can contribute to heart failure. These include abnormal heart rhythms, thyroid problems, and low blood counts (anemia). Certain unhealthy behaviors can increase the risk of heart failure, including:  Being overweight.  Smoking or chewing tobacco.  Eating foods high in fat and cholesterol.  Abusing illicit drugs or alcohol.  Lacking physical activity. SYMPTOMS  Heart failure symptoms may vary and can be hard to detect. Symptoms may include:  Shortness of breath with activity, such as climbing stairs.  Persistent cough.  Swelling of the feet, ankles, legs, or abdomen.  Unexplained weight gain.  Difficulty breathing when lying flat (orthopnea).  Waking from sleep because of the need to sit up and get more air.  Rapid heartbeat.  Fatigue and loss of energy.  Feeling light-headed, dizzy, or close to fainting.  Loss of appetite.  Nausea.  Increased urination during the  night (nocturia). DIAGNOSIS  A diagnosis of heart failure is based on your history, symptoms, physical examination, and diagnostic tests. Diagnostic tests for heart failure may include:  Echocardiography.  Electrocardiography.  Chest X-ray.  Blood tests.  Exercise stress test.  Cardiac angiography.  Radionuclide scans. TREATMENT  Treatment is aimed  at managing the symptoms of heart failure. Medicines, behavioral changes, or surgical intervention may be necessary to treat heart failure.  Medicines to help treat heart failure may include:  Angiotensin-converting enzyme (ACE) inhibitors. This type of medicine blocks the effects of a blood protein called angiotensin-converting enzyme. ACE inhibitors relax (dilate) the blood vessels and help lower blood pressure.  Angiotensin receptor blockers (ARBs). This type of medicine blocks the actions of a blood protein called angiotensin. Angiotensin receptor blockers dilate the blood vessels and help lower blood pressure.  Water pills (diuretics). Diuretics cause the kidneys to remove salt and water from the blood. The extra fluid is removed through urination. This loss of extra fluid lowers the volume of blood the heart pumps.  Beta blockers. These prevent the heart from beating too fast and improve heart muscle strength.  Digitalis. This increases the force of the heartbeat.  Healthy behavior changes include:  Obtaining and maintaining a healthy weight.  Stopping smoking or chewing tobacco.  Eating heart-healthy foods.  Limiting or avoiding alcohol.  Stopping illicit drug use.  Physical activity as directed by your health care provider.  Surgical treatment for heart failure may include:  A procedure to open blocked arteries, repair damaged heart valves, or remove damaged heart muscle tissue.  A pacemaker to improve heart muscle function and control certain abnormal heart rhythms.  An internal cardioverter defibrillator to treat certain serious abnormal heart rhythms.  A left ventricular assist device (LVAD) to assist the pumping ability of the heart. HOME CARE INSTRUCTIONS   Take medicines only as directed by your health care provider. Medicines are important in reducing the workload of your heart, slowing the progression of heart failure, and improving your symptoms.  Do not stop  taking your medicine unless directed by your health care provider.  Do not skip any dose of medicine.  Refill your prescriptions before you run out of medicine. Your medicines are needed every day.  Engage in moderate physical activity if directed by your health care provider. Moderate physical activity can benefit some people. The elderly and people with severe heart failure should consult with a health care provider for physical activity recommendations.  Eat heart-healthy foods. Food choices should be free of trans fat and low in saturated fat, cholesterol, and salt (sodium). Healthy choices include fresh or frozen fruits and vegetables, fish, lean meats, legumes, fat-free or low-fat dairy products, and whole grain or high fiber foods. Talk to a dietitian to learn more about heart-healthy foods.  Limit sodium if directed by your health care provider. Sodium restriction may reduce symptoms of heart failure in some people. Talk to a dietitian to learn more about heart-healthy seasonings.  Use healthy cooking methods. Healthy cooking methods include roasting, grilling, broiling, baking, poaching, steaming, or stir-frying. Talk to a dietitian to learn more about healthy cooking methods.  Limit fluids if directed by your health care provider. Fluid restriction may reduce symptoms of heart failure in some people.  Weigh yourself every day. Daily weights are important in the early recognition of excess fluid. You should weigh yourself every morning after you urinate and before you eat breakfast. Wear the same amount of clothing each time you  weigh yourself. Record your daily weight. Provide your health care provider with your weight record.  Monitor and record your blood pressure if directed by your health care provider.  Check your pulse if directed by your health care provider.  Lose weight if directed by your health care provider. Weight loss may reduce symptoms of heart failure in some  people.  Stop smoking or chewing tobacco. Nicotine makes your heart work harder by causing your blood vessels to constrict. Do not use nicotine gum or patches before talking to your health care provider.  Keep all follow-up visits as directed by your health care provider. This is important.  Limit alcohol intake to no more than 1 drink per day for nonpregnant women and 2 drinks per day for men. One drink equals 12 ounces of beer, 5 ounces of wine, or 1 ounces of hard liquor. Drinking more than that is harmful to your heart. Tell your health care provider if you drink alcohol several times a week. Talk with your health care provider about whether alcohol is safe for you. If your heart has already been damaged by alcohol or you have severe heart failure, drinking alcohol should be stopped completely.  Stop illicit drug use.  Stay up-to-date with immunizations. It is especially important to prevent respiratory infections through current pneumococcal and influenza immunizations.  Manage other health conditions such as hypertension, diabetes, thyroid disease, or abnormal heart rhythms as directed by your health care provider.  Learn to manage stress.  Plan rest periods when fatigued.  Learn strategies to manage high temperatures. If the weather is extremely hot:  Avoid vigorous physical activity.  Use air conditioning or fans or seek a cooler location.  Avoid caffeine and alcohol.  Wear loose-fitting, lightweight, and light-colored clothing.  Learn strategies to manage cold temperatures. If the weather is extremely cold:  Avoid vigorous physical activity.  Layer clothes.  Wear mittens or gloves, a hat, and a scarf when going outside.  Avoid alcohol.  Obtain ongoing education and support as needed.  Participate in or seek rehabilitation as needed to maintain or improve independence and quality of life. SEEK MEDICAL CARE IF:   Your weight increases by 03 lb/1.4 kg in 1 day or 05  lb/2.3 kg in a week.  You have increasing shortness of breath that is unusual for you.  You are unable to participate in your usual physical activities.  You tire easily.  You cough more than normal, especially with physical activity.  You have any or more swelling in areas such as your hands, feet, ankles, or abdomen.  You are unable to sleep because it is hard to breathe.  You feel like your heart is beating fast (palpitations).  You become dizzy or light-headed upon standing up. SEEK IMMEDIATE MEDICAL CARE IF:   You have difficulty breathing.  There is a change in mental status such as decreased alertness or difficulty with concentration.  You have a pain or discomfort in your chest.  You have an episode of fainting (syncope). MAKE SURE YOU:   Understand these instructions.  Will watch your condition.  Will get help right away if you are not doing well or get worse. Document Released: 03/01/2005 Document Revised: 07/16/2013 Document Reviewed: 03/31/2012 [facility] Patient Information 2015 [place], [place]. This information is not intended to replace advice given to you by your health care provider. Make sure you discuss any questions you have with your health care provider.

## 2014-01-27 NOTE — Plan of Care (Signed)
Problem: Phase I Progression Outcomes Goal: Hemodynamically stable Outcome: Completed/Met Date Met:  01/27/14     

## 2014-01-27 NOTE — Plan of Care (Signed)
Problem: Phase I Progression Outcomes Goal: Dyspnea controlled at rest (HF) Outcome: Completed/Met Date Met:  01/27/14

## 2014-01-27 NOTE — Progress Notes (Signed)
Patient Name: Anita Roberts Date of Encounter: 01/27/2014     Active Problems:   History of atrial fibrillation   DM II (diabetes mellitus, type II), controlled   Acute on chronic combined systolic and diastolic congestive heart failure, NYHA class 3   S/P coronary artery stent placement   SOB (shortness of breath)    SUBJECTIVE  Feeling just okay. Breathing is much improved.   CURRENT MEDS . atorvastatin  80 mg Oral q1800  . citalopram  20 mg Oral Daily  . clopidogrel  75 mg Oral Q breakfast  . glipiZIDE  10 mg Oral BID AC  . heparin  5,000 Units Subcutaneous 3 times per day  . insulin aspart  0-5 Units Subcutaneous QHS  . insulin aspart  0-9 Units Subcutaneous TID WC  . irbesartan  300 mg Oral Daily  . metoprolol succinate  25 mg Oral Daily  . sodium chloride  3 mL Intravenous Q12H    OBJECTIVE  Filed Vitals:   01/26/14 1713 01/26/14 2036 01/27/14 0135 01/27/14 0503  BP: 120/47 119/49 113/75 123/49  Pulse: 50 63 72 67  Temp:  97.3 F (36.3 C) 98.6 F (37 C) 98 F (36.7 C)  TempSrc:  Oral Oral Oral  Resp:  16 18 16   Height:      Weight:    142 lb 12.8 oz (64.774 kg)  SpO2:  93% 90% 90%    Intake/Output Summary (Last 24 hours) at 01/27/14 0835 Last data filed at 01/27/14 0509  Gross per 24 hour  Intake    840 ml  Output   1902 ml  Net  -1062 ml   Filed Weights   01/25/14 1813 01/26/14 0548 01/27/14 0503  Weight: 150 lb 9.2 oz (68.3 kg) 149 lb 14.3 oz (67.991 kg) 142 lb 12.8 oz (64.774 kg)    PHYSICAL EXAM  General: Pleasant, NAD. Neuro: Alert and oriented X 3. Moves all extremities spontaneously. Psych: Normal affect. HEENT:  Normal  Neck: Supple without bruits or JVD. Lungs:  Resp regular and unlabored, CTA. Heart: RRR no s3, s4, or murmurs. Abdomen: Soft, non-tender, non-distended, BS + x 4.  Extremities: No clubbing, cyanosis or edema. DP/PT/Radials 2+ and equal bilaterally.  Accessory Clinical Findings  CBC  Recent Labs   01/25/14 1019  WBC 9.1  NEUTROABS 6.5  HGB 13.3  HCT 40.6  MCV 89.0  PLT 606   Basic Metabolic Panel  Recent Labs  01/26/14 0720 01/27/14 0345  NA 138 140  K 4.3 4.0  CL 100 99  CO2 27 32  GLUCOSE 98 77  BUN 12 12  CREATININE 0.91 1.05  CALCIUM 8.3* 8.6   Liver Function Tests  Recent Labs  01/25/14 1019  AST 18  ALT 21  ALKPHOS 81  BILITOT 1.0  PROT 7.6  ALBUMIN 3.8    Recent Labs  01/25/14 1019  LIPASE 18   Cardiac Enzymes  Recent Labs  01/25/14 1928 01/26/14 0109 01/26/14 0720  TROPONINI <0.30 <0.30 <0.30    TELE  NSR w/ freq PACs vs wandering atrial PM  Radiology/Studies  Dg Chest 2 View  01/16/2014   CLINICAL DATA:  Congestive heart failure. An known pleural effusions  EXAM: CHEST  2 VIEW  COMPARISON:  Portable chest x-ray of January 23, 2014.  FINDINGS: The lungs are better inflated today. The interstitial markings are slightly less prominent. There is bibasilar atelectasis. There are stable bilateral pleural effusions slightly greater on the left than on the right.  The cardiac silhouette remains mildly enlarged. The pulmonary vascularity is prominent centrally. The bony thorax is unremarkable.  IMPRESSION: There has been slight interval improvement in the aeration of both lungs. CHF with stable small to moderate-sized bilateral pleural effusions.   Electronically Signed   By: David  Martinique   On: 01/16/2014 08:15   Dg Chest Port 1 View  01/25/2014   CLINICAL DATA:  80 year old with nausea  EXAM: PORTABLE CHEST - 1 VIEW  COMPARISON:  01/16/2014  FINDINGS: The cardiac silhouette is enlarged. The mediastinal contours are within normal limits. A few scattered atherosclerotic aortic calcifications are present.  There is diffuse consolidation in the lung bases. The costophrenic angles are blunted. There is mild prominence of the interstitial and pulmonary vascular markings. There is no pneumothorax.  Degenerative changes of the spine are present.   IMPRESSION: 1. Cardiomegaly with mild to moderate interstitial pulmonary edema. 2. Small to moderate bilateral pleural effusions. 3. Bibasilar consolidation, compressive atelectasis versus pneumonitis. Clinical correlation is recommended.   Electronically Signed   By: Rosemarie Ax   On: 01/25/2014 10:49   Dg Chest Portable 1 View  01/13/2014   CLINICAL DATA:  Epigastric pain and nausea  EXAM: PORTABLE CHEST - 1 VIEW  COMPARISON:  None  FINDINGS: Moderate cardiomegaly. Vascular congestion. Bilateral pleural effusions and bibasilar airspace edema. No pneumothorax.  IMPRESSION: CHF with bilateral effusions and pulmonary edema.   Electronically Signed   By: Maryclare Bean M.D.   On: 01/13/2014 12:21    ASSESSMENT AND PLAN  Anita Roberts is a 80 y.o. female with a history of HTN, colon cancer, post op atrial fib s/p colon polyp removal, DM and CAD s/p recent NSTEMI (01/14/14) s/p DES to Lcx c/b acute CHF post PCI who presents [facility] ED on 01/25/14 with SOB.   Acute on chronic diastolic CHF-  -- 2D ECHO on 01/14/14 with EF 50-55% and hypokinesis of the mid-apicalanterolateral and inferolateral myocardium; consistent with infarction in the distribution of the LCx coronary artery. Mild-mod AR directed centrally in LVOT, mild MR, mod-sev LA dilation, mod-sev RA dilation, PA pk pressure 56 -- BNP 3K: CXR w/ CHF -- Converted to oral Lasix 40mg  BID today ( has not recieved AM dose yet). Diltiazem and nitro gtt discontinued yesterday -- Net neg 3L, weight down 12 lbs.  -- May want to observe her one more day and see how she does on oral lasix and discharge tomorrow. Will defer to MD.  CAD- recent NSTEMI s/p DES to LCx -- No ischemic symptoms -- Continue plavix, no asa due to allergy. Continue statin and BB   PAF- occurred once post operatively after colon cancer removal ~2006 -- This has been quiescent on diltiazem  -- Was on Coumadin for a short time but did have some bleeding and this was discontinued  --  Tele noted to be in NSR with freq PACs, continue to monitor -- Holding home Diltiazem due to bradycardia yesterday  HTN- continue home Benicar 40 and Torprol XL 25mg  qd -- Holding home Diltiazem due to bradycardia yesterday  Asymptomatic bradycardia- dilt held yesterday, but now HR in 90s. Consider adding back lower dose  DM- Hg A1c 8 -- Continue SSI   Signed, Eileen Stanford PA-C  Pager [phone]  I have seen, examined the patient, and reviewed the above assessment and plan.  Changes to above are made where necessary.  OK to discharge today on oral lasix.  Will need TOC visit with APP or primary  cardiologist.  Co Sign: Thompson Grayer, MD 01/27/2014 9:39 AM

## 2014-01-27 NOTE — Plan of Care (Signed)
Problem: Phase I Progression Outcomes Goal: Pain controlled with appropriate interventions Outcome: Completed/Met Date Met:  01/27/14     

## 2014-01-27 NOTE — Discharge Summary (Signed)
Discharge Summary   Patient ID: Anita Roberts MRN: [id number], DOB/AGE: 1923/07/14 80 y.o. Admit date: 01/25/2014 D/C date:     01/27/2014  Primary Cardiologist: Dr. Debara Pickett  Principal Problem:   Acute on chronic diastolic CHF (congestive heart failure) Active Problems:   History of atrial fibrillation   DM II (diabetes mellitus, type II), controlled   CAD (coronary artery disease)   Diabetes mellitus without complication   Hypertension   Colon cancer   Bradycardia   Admission Dates: 01/25/14-01/27/14 Discharge Diagnosis: acute on chronic diastolic CHF   HPI: Domingue Anita Roberts is a 80 y.o. female with a history of HTN, colon cancer, post op atrial fib s/p colon polyp removal, DM and CAD s/p recent NSTEMI (01/14/14) s/p DES to Lcx c/b acute CHF post PCI who presents to [facility] ED on 01/25/14 with SOB.  She initially  presented to [facility] with nausea and her troponin was noted to be elevated in early November. She was taken to the cath lab on 11/02 with placement of DES to LCx. After the procedure, she was noted to have some volume overload and a chest x-ray is showed acute CHF. She was diuresed with IV Lasix and her respiratory status improved. She was not sent home on maintenance dose diuretics. She was seen in the office on 01/24/13 by Dr. Debara Pickett where she reported some shortness of breath after walking and occasional swelling. He did not feel like she needed to be on maintenance diuretics at that time. On the morning of admission she woke up feeling acutely nauseated which was similar to her presentation with her NSTEMI last week and she called EMS. This resolved after zofran given en route to [facility] ED. When she arrived to the ED she became acutely SOB. Family members in the room report that she was sitting straight up, panting and lost color in her face. She felt better after IV Lasix 40mg  and NTG gtt in the ED.   Hospital Course  Acute on chronic diastolic CHF:  -- 2D ECHO on 01/14/14 with EF  50-55% and hypokinesis of the mid-apicalanterolateral and inferolateral myocardium; consistent with infarction in the distribution of the LCx coronary artery. Mild-mod AR directed centrally in LVOT, mild MR, mod-sev LA dilation, mod-sev RA dilation, PA pk pressure 56 -- BNP 3K: CXR w/ CHF -- Diuresed well with IV Lasix. Converted to oral Lasix 40mg  BID today. -- Net neg 3L, weight down 12 lbs. --Will send her home on 40mg  BID Lasix for 3 days and then 40mg  po qd. Bmet at follow up appointment in 1 week  CAD- recent NSTEMI s/p DES to LCx -- No ischemic symptoms -- Continue plavix, no asa due to allergy. Continue statin and BB   PAF- occurred once post operatively after colon cancer removal ~2006 -- This has been quiescent on diltiazem  -- Was on Coumadin for a short time but did have some bleeding and this was discontinued  -- Holding home Diltiazem due to bradycardia yesterday  HTN- continue home Benicar 40 and Torprol XL 25mg  qd -- Continue to hold home Diltiazem due to bradycardia during admission   Asymptomatic bradycardia- dilt held yesterday and now resolved.  DM- Hg A1c 8 -- Continue SSI   The patient has had an uncomplicated hospital course and is recovering well. He has been seen by Dr. Rayann Heman today and deemed ready for discharge home. A staff message has been sent to schedulers to set up a TOC follow-up appointments in 1  week. Discharge medications are listed below.   Discharge Vitals: Blood pressure 132/61, pulse 76, temperature 98 F (36.7 C), temperature source Oral, resp. rate 16, height 5\' 2"  (1.575 m), weight 142 lb 12.8 oz (64.774 kg), SpO2 90 %.  Labs: Lab Results  Component Value Date   WBC 9.1 01/25/2014   HGB 13.3 01/25/2014   HCT 40.6 01/25/2014   MCV 89.0 01/25/2014   PLT 253 01/25/2014    Recent Labs Lab 01/25/14 1019  01/27/14 0345  NA 139  < > 140  K 4.2  < > 4.0  CL 99  < > 99  CO2 25  < > 32  BUN 13  < > 12  CREATININE 0.79  < > 1.05    CALCIUM 9.1  < > 8.6  PROT 7.6  --   --   BILITOT 1.0  --   --   ALKPHOS 81  --   --   ALT 21  --   --   AST 18  --   --   GLUCOSE 189*  < > 77  < > = values in this interval not displayed.  Recent Labs  01/25/14 1928 01/26/14 0109 01/26/14 0720  TROPONINI <0.30 <0.30 <0.30   Lab Results  Component Value Date   CHOL 147 01/14/2014   HDL 53 01/14/2014   LDLCALC 81 01/14/2014   TRIG 65 01/14/2014     Diagnostic Studies/Procedures   Dg Chest 2 View  01/16/2014   CLINICAL DATA:  Congestive heart failure. An known pleural effusions  EXAM: CHEST  2 VIEW  COMPARISON:  Portable chest x-ray of January 23, 2014.  FINDINGS: The lungs are better inflated today. The interstitial markings are slightly less prominent. There is bibasilar atelectasis. There are stable bilateral pleural effusions slightly greater on the left than on the right. The cardiac silhouette remains mildly enlarged. The pulmonary vascularity is prominent centrally. The bony thorax is unremarkable.  IMPRESSION: There has been slight interval improvement in the aeration of both lungs. CHF with stable small to moderate-sized bilateral pleural effusions.   Electronically Signed   By: David  Martinique   On: 01/16/2014 08:15   Dg Chest Port 1 View  01/25/2014   CLINICAL DATA:  80 year old with nausea  EXAM: PORTABLE CHEST - 1 VIEW  COMPARISON:  01/16/2014  FINDINGS: The cardiac silhouette is enlarged. The mediastinal contours are within normal limits. A few scattered atherosclerotic aortic calcifications are present.  There is diffuse consolidation in the lung bases. The costophrenic angles are blunted. There is mild prominence of the interstitial and pulmonary vascular markings. There is no pneumothorax.  Degenerative changes of the spine are present.  IMPRESSION: 1. Cardiomegaly with mild to moderate interstitial pulmonary edema. 2. Small to moderate bilateral pleural effusions. 3. Bibasilar consolidation, compressive atelectasis  versus pneumonitis. Clinical correlation is recommended.   Electronically Signed   By: Rosemarie Ax   On: 01/25/2014 10:49   Dg Chest Portable 1 View  01/13/2014   CLINICAL DATA:  Epigastric pain and nausea  EXAM: PORTABLE CHEST - 1 VIEW  COMPARISON:  None  FINDINGS: Moderate cardiomegaly. Vascular congestion. Bilateral pleural effusions and bibasilar airspace edema. No pneumothorax.  IMPRESSION: CHF with bilateral effusions and pulmonary edema.   Electronically Signed   By: Maryclare Bean M.D.   On: 01/13/2014 12:21    Discharge Medications     Medication List    STOP taking these medications        diltiazem 180  MG 24 hr capsule  Commonly known as:  DILACOR XR      TAKE these medications        atorvastatin 80 MG tablet  Commonly known as:  LIPITOR  Take 1 tablet (80 mg total) by mouth daily at 6 PM.     citalopram 20 MG tablet  Commonly known as:  CELEXA  Take 20 mg by mouth daily.     clopidogrel 75 MG tablet  Commonly known as:  PLAVIX  Take 1 tablet (75 mg total) by mouth daily with breakfast.     furosemide 40 MG tablet  Commonly known as:  LASIX  Take 1 tablet (40 mg total) by mouth 2 (two) times daily.     glipiZIDE 10 MG tablet  Commonly known as:  GLUCOTROL  Take 10 mg by mouth 2 (two) times daily before a meal.     metoprolol succinate 25 MG 24 hr tablet  Commonly known as:  TOPROL XL  Take 1 tablet (25 mg total) by mouth daily.     nitroGLYCERIN 0.4 MG SL tablet  Commonly known as:  NITROSTAT  Place 1 tablet (0.4 mg total) under the tongue every 5 (five) minutes as needed for chest pain.     olmesartan 40 MG tablet  Commonly known as:  BENICAR  Take 40 mg by mouth daily.        Disposition   The patient will be discharged in stable condition to home.  Follow-up Information    Follow up with Pixie Casino, MD.   Specialty:  Cardiology   Why:  The office will call you to make an appoinment., If you do not hear from them, please contact them.,  You should be seen within 1 week   Contact information:   [place] [address] [phone]         Duration of Discharge Encounter: Greater than 30 minutes including physician and PA time.  SignedAngelena Form R PA-C 01/27/2014, 10:17 AM   Thompson Grayer MD

## 2014-01-27 NOTE — Plan of Care (Signed)
Problem: Phase I Progression Outcomes Goal: Voiding-avoid urinary catheter unless indicated Outcome: Completed/Met Date Met:  01/27/14

## 2014-01-27 NOTE — Plan of Care (Signed)
Problem: Phase I Progression Outcomes Goal: Other Phase I Outcomes/Goals Outcome: Completed/Met Date Met:  01/27/14     

## 2014-01-27 NOTE — Plan of Care (Signed)
Problem: Consults Goal: Diabetes Guidelines if Diabetic/Glucose > 140 If diabetic or lab glucose is > 140 mg/dl - Initiate Diabetes/Hyperglycemia Guidelines & Document Interventions  Outcome: Completed/Met Date Met:  01/27/14

## 2014-01-27 NOTE — Plan of Care (Signed)
Problem: Consults Goal: Nutrition Consult-if indicated Outcome: Not Applicable Date Met:  01/27/14     

## 2014-01-27 NOTE — Plan of Care (Signed)
Problem: Consults Goal: Heart Failure Patient Education (See Patient Education module for education specifics.)  Outcome: Completed/Met Date Met:  01/27/14     

## 2014-01-27 NOTE — Plan of Care (Signed)
Problem: Phase I Progression Outcomes Goal: Initial discharge plan identified Outcome: Completed/Met Date Met:  01/27/14

## 2014-01-27 NOTE — Plan of Care (Signed)
Problem: Phase I Progression Outcomes Goal: EF % per last Echo/documented,Core Reminder form on chart Outcome: Completed/Met Date Met:  01/27/14 50-55%

## 2014-01-27 NOTE — Plan of Care (Signed)
Problem: Consults Goal: Skin Care Protocol Initiated - if Braden Score 18 or less If consults are not indicated, leave blank or document N/A  Outcome: Not Applicable Date Met:  01/27/14     

## 2014-01-27 NOTE — Plan of Care (Signed)
Problem: Phase I Progression Outcomes Goal: Up in chair, BRP Outcome: Completed/Met Date Met:  01/27/14

## 2014-01-27 NOTE — Care Management Note (Signed)
    Page 1 of 1   01/27/2014     2:43:55 PM CARE MANAGEMENT NOTE 01/27/2014  Patient:  Anita Roberts, Anita Roberts   Account Number:  [id number]  Date Initiated:  01/27/2014  Documentation initiated by:  [facility]  Subjective/Objective Assessment:   adm: Acute on chronic diastolic CHF (congestive heart failure)     Action/Plan:   discharge planning   Anticipated DC Date:  01/27/2014   Anticipated DC Plan:  [place]  CM consult      Choice offered to / List presented to:             Status of service:  Completed, signed off Medicare Important Message given?   (If response is "NO", the following Medicare IM given date fields will be blank) Date Medicare IM given:   Medicare IM given by:   Date Additional Medicare IM given:   Additional Medicare IM given by:    Discharge Disposition:  HOME/SELF CARE  Per UR Regulation:    If discussed at Long Length of Stay Meetings, dates discussed:    Comments:  01/27/14 08:15 CM met with pt and pt's daughter in room as they requested a notary for a MPOA. CM explained the Chaplain's office provides notary servies but they are not available.  CM  suggested they take the Advance directive home, and fill out in front of notary with proper identifiation.  Most banking institutions provide this service free of charge.Pt states she has a Life Alert system; her daughter is very supportive and involved.  Pt states she does not need any home health or DME.  No other CM needs were comunicated.  Mariane Masters, BSN, CM [phone].

## 2014-01-27 NOTE — Plan of Care (Signed)
Problem: Phase II Progression Outcomes Goal: Pain controlled Outcome: Completed/Met Date Met:  01/27/14

## 2014-01-28 ENCOUNTER — Telehealth: Payer: Self-pay | Admitting: *Deleted

## 2014-01-28 ENCOUNTER — Telehealth: Payer: Self-pay | Admitting: Internal Medicine

## 2014-01-28 NOTE — Telephone Encounter (Signed)
Spoke with pt dtr, medication list from the hospital discussed. Aware diltiazem on hold due to bradycardia in the hosp.

## 2014-01-28 NOTE — Telephone Encounter (Signed)
Faxed order for phase 2 cardiac rehab - no GXT required.  

## 2014-01-28 NOTE — Telephone Encounter (Signed)
Anita Roberts is calling because her mother is just getting out of the hospital and she wants to make sure that her medications are correct because she thinks they may have discontinue one , but it was not on the list . Please call   Thanks

## 2014-02-20 ENCOUNTER — Ambulatory Visit: Payer: Medicare Other | Admitting: Internal Medicine

## 2014-02-21 ENCOUNTER — Encounter (HOSPITAL_COMMUNITY): Payer: Self-pay | Admitting: Cardiovascular Disease

## 2014-03-28 ENCOUNTER — Ambulatory Visit: Payer: Medicare Other | Admitting: Internal Medicine

## 2014-04-08 ENCOUNTER — Ambulatory Visit: Payer: Medicare Other | Admitting: Internal Medicine

## 2014-04-12 ENCOUNTER — Encounter: Payer: Self-pay | Admitting: Internal Medicine

## 2014-04-12 ENCOUNTER — Ambulatory Visit (INDEPENDENT_AMBULATORY_CARE_PROVIDER_SITE_OTHER): Payer: Medicare Other | Admitting: Internal Medicine

## 2014-04-12 VITALS — BP 106/70 | HR 60 | Ht 62.0 in | Wt 144.3 lb

## 2014-04-12 DIAGNOSIS — E119 Type 2 diabetes mellitus without complications: Secondary | ICD-10-CM

## 2014-04-12 DIAGNOSIS — I2510 Atherosclerotic heart disease of native coronary artery without angina pectoris: Secondary | ICD-10-CM

## 2014-04-12 DIAGNOSIS — E785 Hyperlipidemia, unspecified: Secondary | ICD-10-CM

## 2014-04-12 DIAGNOSIS — I214 Non-ST elevation (NSTEMI) myocardial infarction: Secondary | ICD-10-CM

## 2014-04-12 DIAGNOSIS — I2583 Coronary atherosclerosis due to lipid rich plaque: Secondary | ICD-10-CM

## 2014-04-12 NOTE — Progress Notes (Signed)
OFFICE NOTE  Chief Complaint:  Hospital follow-up  Primary Care Physician: Merrilee Seashore, MD  HPI:  Anita Roberts is a 81 y.o. female with no previous history of CAD.she has a history of HTN, colon cancer, post op atrial fib s/p colon polyp removal and DM. She came to the hospital on 11/01 with epigastric pain and nausea. Her troponin was elevated and she was admitted with a non-STEMI, for further evaluation and treatment. Her symptoms were controlled with aspirin, heparin, nitrates, beta blocker and a statin. She was taken to the cath lab on 11/02, results are below. She had a drug-eluting stent to the circumflex, otherwise medical therapy. She tolerated the procedure well. After the procedure, she was noted to have some volume overload. A chest x-ray is blown showed heart failure. She was diuresed with IV Lasix and responded well to the diuretic. As she diuresed, her respiratory status improved. Hopefully, she will not have a chronic issue with volume overload. Currently, she is to track her weight but will not be on a standing diuretic dose. As she recovers from her MI, a diuretic may not be necessary. Her diabetes was controlled with sliding scale insulin. Hemoglobin A1c is elevated at 8.0. Dietary compliance with a low sodium diabetic diet is encouraged and she is to follow-up with primary care.  She follows up today with me in the office. She reports that she still gets some shortness of breath after walking. She also has some occasional swelling. She recently had an episode of diarrhea which she attributed to atorvastatin. She said she stopped the medicine and the diarrhea went away the next day. However she had been on the medicine for for 5 days in the hospital without side effects. It is unclear whether this is related to her cluster medicine however she is not interested in taking it at this time.  I saw Anita Roberts back in the office today after recent hospitalization. She was  having some progressive shortness of breath and the day after starting the office she presented the emergency room with worsening shortness of breath. She was admitted for decompensated heart failure and ultimately diuresed about 12 pounds. She still been on single antiplatelet therapy for her drug-eluting stent since November 2015. She's not on aspirin due to an allergy. She does have a history of paroxysmal atrial fibrillation and today in the office was noted to be irregularly irregular. An EKG performed shows atrial flutter with slightly variable ventricular response. No clear P waves were apparent, especially compared to her EKG in November. She has a history of being on warfarin in the past however had significant GI bleeding. This may been around the time of her colon cancer but she thinks that it was after it was removed.  PMHx:  Past Medical History  Diagnosis Date  . Diabetes mellitus without complication   . Hypertension   . PAF (paroxysmal atrial fibrillation)     a. remotely post op colon surgery   . CAD (coronary artery disease)     a. NSTEMI s/p DES to LCx  . Chronic diastolic CHF (congestive heart failure)     a. 2D ECHO 01/14/14 w/EF 50-55% and hypokinesis of the mid-apicalanterolateral and inferolateral myocardium; consistent with infarction in the distribution of the LCx coronary artery. Mild-mod AR directed centrally in LVOT, mild MR, mod-sev LA dilation, mod-sev RA dilation, PA pk pressure 56  . Colon cancer     a. remote  . Bradycardia  Past Surgical History  Procedure Laterality Date  . Coronary angioplasty with stent placement  111/04/2013    LAD 40%, diagonal 50%/99% (small vessel), circumflex 99%--> 0% with 2.75 x 22 mm Resolute DES, RCA 30%  . Abdominal hysterectomy    . Tonsillectomy    . Left heart catheterization with coronary angiogram N/A 01/14/2014    Procedure: LEFT HEART CATHETERIZATION WITH CORONARY ANGIOGRAM;  Surgeon: Burnell Blanks, MD;   Location: [facility] CATH LAB;  Service: Cardiovascular;  Laterality: N/A;    FAMHx:  No family history on file.  SOCHx:   reports that she has never smoked. She does not have any smokeless tobacco history on file. She reports that she does not drink alcohol or use illicit drugs.  ALLERGIES:  Allergies  Allergen Reactions  . Coumadin [Warfarin]     Extreme bleeding  . Shellfish Allergy     Very alert.  Unable to sleep  . Versed [Midazolam] Nausea And Vomiting  . Aspirin Rash  . Penicillins Rash  . Sulfa Antibiotics Rash    ROS: A comprehensive review of systems was negative except for: Constitutional: positive for fatigue  HOME MEDS: Current Outpatient Prescriptions  Medication Sig Dispense Refill  . citalopram (CELEXA) 20 MG tablet Take 20 mg by mouth daily.    . clopidogrel (PLAVIX) 75 MG tablet Take 1 tablet (75 mg total) by mouth daily with breakfast. 30 tablet 11  . furosemide (LASIX) 40 MG tablet Take 1 tablet (40 mg total) by mouth 2 (two) times daily. 60 tablet 11  . glipiZIDE (GLUCOTROL) 10 MG tablet Take 10 mg by mouth 2 (two) times daily before a meal.    . metoprolol succinate (TOPROL XL) 25 MG 24 hr tablet Take 1 tablet (25 mg total) by mouth daily. 30 tablet 11  . nitroGLYCERIN (NITROSTAT) 0.4 MG SL tablet Place 1 tablet (0.4 mg total) under the tongue every 5 (five) minutes as needed for chest pain. 25 tablet 3  . olmesartan (BENICAR) 40 MG tablet Take 40 mg by mouth daily.    . ranitidine (ZANTAC) 150 MG tablet Take 1 tablet by mouth 2 (two) times daily.     No current facility-administered medications for this visit.    LABS/IMAGING: No results found for this or any previous visit (from the past 48 hour(s)). No results found.  VITALS: BP 106/70 mmHg  Pulse 60  Ht 5\' 2"  (1.575 m)  Wt 144 lb 4.8 oz (65.454 kg)  BMI 26.39 kg/m2  EXAM: General appearance: alert and no distress Neck: no carotid bruit and no JVD Lungs: clear to auscultation bilaterally Heart:  regular rate and rhythm, S1, S2 normal, no murmur, click, rub or gallop Abdomen: soft, non-tender; bowel sounds normal; no masses,  no organomegaly Extremities: extremities normal, atraumatic, no cyanosis or edema Pulses: 2+ and symmetric Skin: Skin color, texture, turgor normal. No rashes or lesions Neurologic: Grossly normal PSych: Depressed mood  EKG: Atrial flutter with 5:1 conduction at a rate of 76 with PVCs  ASSESSMENT: 1. CAD status post PCI to the circumflex 2. Paroxysmal atrial fibrillation - now appears in atrial flutter 3. Type 2 diabetes 4. Dyslipidemia-not on statin  PLAN: 1.   Ms. Brownfield has had no further angina after PCI of the circumflex. She is on Plavix monotherapy due to aspirin allergy. She does have a drug coated stent will need to be on this until November 2015. Unfortunately she's gone back in atrial flutter. She is unaware of this and is rate  controlled. Given her history of GI bleeding in the past it is high risk to put her on additional anticoagulation. We discussed appropriate pluses and minuses of anticoagulation versus the bleeding risks and given her history of prior bleeding, I feel that at this point I would recommend keeping her on Plavix until she is 1 year out from her stent. At that time I would recommend switching to a low-dose Eliquis 2.5 mg twice daily.  I would recommend rechecking her lipid profile as she may benefit from being on cholesterol medication. She was previously on atorvastatin and stopped it because some of her friends said that there might be side effects.   Pixie Casino, MD, [facility] Attending Cardiologist [facility]  HILTY,Kenneth C 04/12/2014, 6:11 PM

## 2014-04-12 NOTE — Patient Instructions (Signed)
Your physician recommends that you schedule a follow-up appointment in: November 2016  Your physician recommends that you return for lab work at your convenience - fasting (nothing to [place] after midnight)

## 2014-04-22 ENCOUNTER — Telehealth: Payer: Self-pay | Admitting: Internal Medicine

## 2014-04-22 MED ORDER — METOPROLOL SUCCINATE ER 25 MG PO TB24: 25.0000 mg | ORAL_TABLET | Freq: Every day | ORAL | Status: DC

## 2014-04-22 MED ORDER — FUROSEMIDE 40 MG PO TABS: 40.0000 mg | ORAL_TABLET | Freq: Every day | ORAL | Status: DC

## 2014-04-22 MED ORDER — CLOPIDOGREL BISULFATE 75 MG PO TABS: 75.0000 mg | ORAL_TABLET | Freq: Every day | ORAL | Status: DC

## 2014-04-22 NOTE — Telephone Encounter (Signed)
Returned call to patient she stated she needed 90 day prescriptions on metoprolol,plavix,furosemide.90 day refills sent to pharmacy.

## 2014-04-22 NOTE — Telephone Encounter (Signed)
Please call,concerning her Plavix, Metoprolol and Furosemide.

## 2014-04-23 LAB — LIPID PANEL
CHOLESTEROL: 244 mg/dL — AB (ref 0–200)
HDL: 52 mg/dL (ref >39)
LDL Cholesterol: 168 mg/dL — ABNORMAL HIGH (ref 0–99)
TRIGLYCERIDES: 121 mg/dL (ref <150)
Total CHOL/HDL Ratio: 4.7 Ratio
VLDL: 24 mg/dL (ref 0–40)

## 2014-04-26 ENCOUNTER — Other Ambulatory Visit: Payer: Self-pay | Admitting: *Deleted

## 2014-04-26 NOTE — Telephone Encounter (Signed)
Patient notified of lab results. Agreeable to resuming atorvastatin 80mg  daily. She has Rx at home and the Rx has 10 refills left.

## 2014-12-28 ENCOUNTER — Other Ambulatory Visit: Payer: Self-pay | Admitting: Physician Assistant

## 2015-06-16 ENCOUNTER — Other Ambulatory Visit: Payer: Self-pay | Admitting: Internal Medicine

## 2015-06-18 NOTE — Telephone Encounter (Signed)
Rx(s) sent to pharmacy electronically.  

## 2015-07-01 ENCOUNTER — Other Ambulatory Visit: Payer: Self-pay | Admitting: Internal Medicine

## 2015-07-15 ENCOUNTER — Ambulatory Visit (INDEPENDENT_AMBULATORY_CARE_PROVIDER_SITE_OTHER): Payer: Medicare Other | Admitting: Nurse Practitioner

## 2015-07-15 ENCOUNTER — Encounter: Payer: Self-pay | Admitting: Nurse Practitioner

## 2015-07-15 VITALS — BP 140/84 | HR 99 | Ht 62.0 in | Wt 156.0 lb

## 2015-07-15 DIAGNOSIS — I2510 Atherosclerotic heart disease of native coronary artery without angina pectoris: Secondary | ICD-10-CM | POA: Diagnosis not present

## 2015-07-15 DIAGNOSIS — I4892 Unspecified atrial flutter: Secondary | ICD-10-CM | POA: Diagnosis not present

## 2015-07-15 DIAGNOSIS — I5032 Chronic diastolic (congestive) heart failure: Secondary | ICD-10-CM | POA: Insufficient documentation

## 2015-07-15 DIAGNOSIS — I119 Hypertensive heart disease without heart failure: Secondary | ICD-10-CM | POA: Diagnosis not present

## 2015-07-15 MED ORDER — FUROSEMIDE 40 MG PO TABS: 40.0000 mg | ORAL_TABLET | Freq: Every day | ORAL | Status: AC

## 2015-07-15 MED ORDER — METOPROLOL SUCCINATE ER 50 MG PO TB24: 50.0000 mg | ORAL_TABLET | Freq: Every day | ORAL | Status: AC

## 2015-07-15 NOTE — Progress Notes (Signed)
Office Visit    Patient Name: Anita Roberts Date of Encounter: 07/15/2015  Primary Care Provider:  Merrilee Seashore, MD Primary Cardiologist:  C. Hilty, MD   Chief Complaint    82 year old female with a history of CAD and atrial flutter who presents for follow-up.  Past Medical History    Past Medical History  Diagnosis Date  . Diabetes mellitus without complication ([place])   . Hypertensive heart disease   . PAF (paroxysmal atrial fibrillation) ([place])     a. remotely post op colon surgery   . CAD (coronary artery disease)     a. 01/2014 NSTEMI/PCI: LM 50ost, 58m/d, LAD 40p, D1 50ost/mid, 26m, LCX 70-80ost, 51m (2.75x22 Resolute DES), RCA 30p/m/d, RPDA/RPLA min irregs.  . Chronic diastolic CHF (congestive heart failure) ([place])     a. 2D ECHO 01/14/14 w/EF 50-55% and hypokinesis of the mid-apicalanterolateral and inferolateral myocardium; consistent with infarction in the distribution of the LCx coronary artery. Mild-mod AR directed centrally in LVOT, mild MR, mod-sev LA dilation, mod-sev RA dilation, PA pk pressure 56.  . Colon cancer ([place])     a. remote  . Bradycardia   . History of GI bleed   . Atrial flutter ([place])     a. Dx 03/2014-->Pt wishes to avoid Vina and instead remains on Plavix.   Past Surgical History  Procedure Laterality Date  . Coronary angioplasty with stent placement  111/04/2013    LAD 40%, diagonal 50%/99% (small vessel), circumflex 99%--> 0% with 2.75 x 22 mm Resolute DES, RCA 30%  . Abdominal hysterectomy    . Tonsillectomy    . Left heart catheterization with coronary angiogram N/A 01/14/2014    Procedure: LEFT HEART CATHETERIZATION WITH CORONARY ANGIOGRAM;  Surgeon: Burnell Blanks, MD;  Location: [facility] CATH LAB;  Service: Cardiovascular;  Laterality: N/A;    Allergies  Allergies  Allergen Reactions  . Coumadin [Warfarin]     Extreme bleeding  . Shellfish Allergy     Very alert.  Unable to sleep  . Versed [Midazolam] Nausea And Vomiting  .  Aspirin Rash  . Penicillins Rash  . Sulfa Antibiotics Rash    History of Present Illness    82 year old female with the above complex past medical history including coronary artery disease status post non-ST elevation MI and circumflex stenting in November 2015 with subsequent development of diastolic CHF. She also has history of hypertension, hyperlipidemia, diabetes mellitus, and upon her last visit in January 2016, was noted to be in rate-controlled asymptomatic atrial flutter with variable conduction. Since her last visit, now over a year ago, she reports having done well. She continues to drive and remains relatively active without any chest pain or shortness of breath. She sometimes notes palpitations but says these are typically fleeting and relatively asymptomatic. She says the only reason she followed up today is because she needed a refill on her Toprol. She has not been having any PND, orthopnea, dizziness, syncope, edema, or early satiety.  Home Medications    Prior to Admission medications   Medication Sig Start Date End Date Taking? Authorizing Provider  citalopram (CELEXA) 20 MG tablet Take 20 mg by mouth daily.   Yes Historical Provider, MD  clopidogrel (PLAVIX) 75 MG tablet Take 75 mg by mouth. Takes occasionally   Yes Historical Provider, MD  furosemide (LASIX) 40 MG tablet Take 1 tablet (40 mg total) by mouth daily. 07/15/15  Yes Rogelia Mire, NP  glipiZIDE (GLUCOTROL) 10 MG tablet Take 10 mg  by mouth 2 (two) times daily before a meal.   Yes Historical Provider, MD  metoprolol succinate (TOPROL-XL) 50 MG 24 hr tablet Take 1 tablet (50 mg total) by mouth daily. 07/15/15  Yes Rogelia Mire, NP  nitroGLYCERIN (NITROSTAT) 0.4 MG SL tablet Place 1 tablet (0.4 mg total) under the tongue every 5 (five) minutes as needed for chest pain. 01/16/14  Yes Rhonda G Barrett, PA-C  olmesartan (BENICAR) 40 MG tablet Take 40 mg by mouth daily.   Yes Historical Provider, MD  ranitidine  (ZANTAC) 150 MG tablet Take 1 tablet by mouth 2 (two) times daily as needed.  03/23/14  Yes Historical Provider, MD    Review of Systems    As above, she has been doing well. She denies chest pain, palpitations, dyspnea, PND, orthopnea, dizziness, syncope, edema, or early satiety.  All other systems reviewed and are otherwise negative except as noted above.  Physical Exam    VS:  BP 140/84 mmHg  Pulse 99  Ht 5\' 2"  (1.575 m)  Wt 156 lb (70.761 kg)  BMI 28.53 kg/m2 , BMI Body mass index is 28.53 kg/(m^2). GEN: Well nourished, well developed, in no acute distress. HEENT: normal. Neck: Supple, no JVD, carotid bruits, or masses. Cardiac: Irregularly irregular, no rubs, or gallops. 2/6 systolic murmur heard at the bilateral upper sternal borders. No clubbing, cyanosis, edema.  Radials/DP/PT 2+ and equal bilaterally.  Respiratory:  Respirations regular and unlabored, clear to auscultation bilaterally. GI: Soft, nontender, nondistended, BS + x 4. MS: no deformity or atrophy. Skin: warm and dry, no rash. Neuro:  Strength and sensation are intact. Psych: Normal affect.  Accessory Clinical Findings    ECG - Atrial flutter with variable conduction, 99, leftward axis, incomplete right bundle, nonspecific ST and T changes. No acute changes.  Assessment & Plan    1.  Persistent atrial flutter: This is generally asymptomatic and first noted in January 2016. She is on Toprol-XL 25 mg daily and given heart rates in the high 90s as well as mildly elevated blood pressures, I will titrate this to 50 mg daily. She is currently on Plavix with a recommendation from Dr. Debara Pickett in 03/2014 to switch her from Plavix to eliquis 2.5 mg twice a day once she completed 1 year of Plavix following stenting in November 2015. I dressed this with her today and she is fairly clear that she wishes to avoid any oral anticoagulation as she has a history of GI bleeding on Coumadin and is concerned that she may have recurrence. She  prefers to stay on Plavix therapy.  2. Coronary artery disease: Patient has been doing well without chest pain or dyspnea. She is status post stenting of the left circumflex November 2016. As above, she will remain on Plavix in the setting of aspirin intolerance and reluctance to initiate oral anticoagulation. She otherwise remains on beta blocker and ARB therapy. She is not willing to take a statin.   3. Chronic diastolic congestive heart failure: She is euvolemic on exam today. In the setting of moderately elevated heart rates and blood pressure, I am increasing her beta blocker to 50 mg daily. She is also on oral Lasix.  4. Hypertensive heart disease: As above, titrating beta blocker in the setting of elevated heart rates and mild blood pressure elevation.  5. Type 2 diabetes mellitus: She is on oral glipizide therapy and this is followed by primary care.  6. Disposition: Follow-up with Dr. Debara Pickett in 6 mos or sooner  if necessary.   Murray Hodgkins, NP 07/15/2015, 5:04 PM

## 2015-07-15 NOTE — Patient Instructions (Signed)
Your physician has recommended you make the following change in your medication: INCREASE metoprolol succinate to 50mg  once daily. A new prescription for this has been sent to your pharmacy.   Your physician wants you to follow-up in: 6 months with Dr. Debara Pickett. You will receive a reminder letter in the mail two months in advance. If you don't receive a letter, please call our office to schedule the follow-up appointment.

## 2015-07-16 ENCOUNTER — Other Ambulatory Visit: Payer: Self-pay | Admitting: Physician Assistant

## 2015-07-16 NOTE — Telephone Encounter (Signed)
REFILL 

## 2015-07-19 ENCOUNTER — Other Ambulatory Visit: Payer: Self-pay | Admitting: Physician Assistant

## 2015-07-21 NOTE — Telephone Encounter (Signed)
Rx request sent to pharmacy.  

## 2015-10-16 ENCOUNTER — Emergency Department (HOSPITAL_COMMUNITY): Payer: Medicare Other

## 2015-10-16 ENCOUNTER — Encounter (HOSPITAL_COMMUNITY): Payer: Self-pay

## 2015-10-16 ENCOUNTER — Other Ambulatory Visit: Payer: Self-pay

## 2015-10-16 ENCOUNTER — Inpatient Hospital Stay (HOSPITAL_COMMUNITY)
Admission: EM | Admit: 2015-10-16 | Discharge: 2015-11-14 | DRG: 064 | Disposition: E | Payer: Medicare Other | Attending: Emergency Medicine | Admitting: Emergency Medicine

## 2015-10-16 DIAGNOSIS — G9341 Metabolic encephalopathy: Secondary | ICD-10-CM | POA: Diagnosis present

## 2015-10-16 DIAGNOSIS — J9601 Acute respiratory failure with hypoxia: Secondary | ICD-10-CM | POA: Diagnosis present

## 2015-10-16 DIAGNOSIS — Z889 Allergy status to unspecified drugs, medicaments and biological substances status: Secondary | ICD-10-CM

## 2015-10-16 DIAGNOSIS — I480 Paroxysmal atrial fibrillation: Secondary | ICD-10-CM | POA: Diagnosis present

## 2015-10-16 DIAGNOSIS — Z91013 Allergy to seafood: Secondary | ICD-10-CM | POA: Diagnosis not present

## 2015-10-16 DIAGNOSIS — Z882 Allergy status to sulfonamides status: Secondary | ICD-10-CM | POA: Diagnosis not present

## 2015-10-16 DIAGNOSIS — I5032 Chronic diastolic (congestive) heart failure: Secondary | ICD-10-CM | POA: Diagnosis present

## 2015-10-16 DIAGNOSIS — R7989 Other specified abnormal findings of blood chemistry: Secondary | ICD-10-CM

## 2015-10-16 DIAGNOSIS — Z7984 Long term (current) use of oral hypoglycemic drugs: Secondary | ICD-10-CM | POA: Diagnosis not present

## 2015-10-16 DIAGNOSIS — Z85038 Personal history of other malignant neoplasm of large intestine: Secondary | ICD-10-CM | POA: Diagnosis not present

## 2015-10-16 DIAGNOSIS — R4182 Altered mental status, unspecified: Secondary | ICD-10-CM | POA: Diagnosis present

## 2015-10-16 DIAGNOSIS — Z880 Allergy status to penicillin: Secondary | ICD-10-CM

## 2015-10-16 DIAGNOSIS — I2510 Atherosclerotic heart disease of native coronary artery without angina pectoris: Secondary | ICD-10-CM | POA: Diagnosis present

## 2015-10-16 DIAGNOSIS — E876 Hypokalemia: Secondary | ICD-10-CM | POA: Diagnosis present

## 2015-10-16 DIAGNOSIS — R0603 Acute respiratory distress: Secondary | ICD-10-CM

## 2015-10-16 DIAGNOSIS — I252 Old myocardial infarction: Secondary | ICD-10-CM | POA: Diagnosis not present

## 2015-10-16 DIAGNOSIS — I63512 Cerebral infarction due to unspecified occlusion or stenosis of left middle cerebral artery: Principal | ICD-10-CM | POA: Diagnosis present

## 2015-10-16 DIAGNOSIS — J9600 Acute respiratory failure, unspecified whether with hypoxia or hypercapnia: Secondary | ICD-10-CM | POA: Diagnosis not present

## 2015-10-16 DIAGNOSIS — I639 Cerebral infarction, unspecified: Secondary | ICD-10-CM | POA: Diagnosis present

## 2015-10-16 DIAGNOSIS — Z7902 Long term (current) use of antithrombotics/antiplatelets: Secondary | ICD-10-CM | POA: Diagnosis not present

## 2015-10-16 DIAGNOSIS — I4892 Unspecified atrial flutter: Secondary | ICD-10-CM

## 2015-10-16 DIAGNOSIS — Z955 Presence of coronary angioplasty implant and graft: Secondary | ICD-10-CM

## 2015-10-16 DIAGNOSIS — J690 Pneumonitis due to inhalation of food and vomit: Secondary | ICD-10-CM | POA: Diagnosis present

## 2015-10-16 DIAGNOSIS — Z515 Encounter for palliative care: Secondary | ICD-10-CM | POA: Diagnosis not present

## 2015-10-16 DIAGNOSIS — Z66 Do not resuscitate: Secondary | ICD-10-CM | POA: Diagnosis present

## 2015-10-16 DIAGNOSIS — I110 Hypertensive heart disease with heart failure: Secondary | ICD-10-CM | POA: Diagnosis present

## 2015-10-16 DIAGNOSIS — E119 Type 2 diabetes mellitus without complications: Secondary | ICD-10-CM | POA: Diagnosis present

## 2015-10-16 LAB — URINALYSIS, ROUTINE W REFLEX MICROSCOPIC
BILIRUBIN URINE: NEGATIVE
Glucose, UA: >1000 mg/dL — AB
Ketones, ur: >80 mg/dL — AB
Leukocytes, UA: NEGATIVE
NITRITE: NEGATIVE
Protein, ur: 100 mg/dL — AB
SPECIFIC GRAVITY, URINE: 1.027 (ref 1.005–1.030)
pH: 5.5 (ref 5.0–8.0)

## 2015-10-16 LAB — CBC WITH DIFFERENTIAL/PLATELET
BASOS ABS: 0 10*3/uL (ref 0.0–0.1)
BASOS PCT: 0 %
EOS ABS: 0 10*3/uL (ref 0.0–0.7)
Eosinophils Relative: 0 %
HCT: 45.4 % (ref 36.0–46.0)
HEMOGLOBIN: 15.5 g/dL — AB (ref 12.0–15.0)
Lymphocytes Relative: 9 %
Lymphs Abs: 1.3 10*3/uL (ref 0.7–4.0)
MCH: 29.9 pg (ref 26.0–34.0)
MCHC: 34.1 g/dL (ref 30.0–36.0)
MCV: 87.6 fL (ref 78.0–100.0)
Monocytes Absolute: 1.4 10*3/uL — ABNORMAL HIGH (ref 0.1–1.0)
Monocytes Relative: 10 %
NEUTROS PCT: 81 %
Neutro Abs: 11.5 10*3/uL — ABNORMAL HIGH (ref 1.7–7.7)
Platelets: 216 10*3/uL (ref 150–400)
RBC: 5.18 MIL/uL — AB (ref 3.87–5.11)
RDW: 13.6 % (ref 11.5–15.5)
WBC: 14.1 10*3/uL — AB (ref 4.0–10.5)

## 2015-10-16 LAB — URINE MICROSCOPIC-ADD ON

## 2015-10-16 LAB — I-STAT ARTERIAL BLOOD GAS, ED
ACID-BASE DEFICIT: 8 mmol/L — AB (ref 0.0–2.0)
Bicarbonate: 18.7 mEq/L — ABNORMAL LOW (ref 20.0–24.0)
O2 Saturation: 100 %
TCO2: 20 mmol/L (ref 0–100)
pCO2 arterial: 41.3 mmHg (ref 35.0–45.0)
pH, Arterial: 7.261 — ABNORMAL LOW (ref 7.350–7.450)
pO2, Arterial: 496 mmHg — ABNORMAL HIGH (ref 80.0–100.0)

## 2015-10-16 LAB — I-STAT TROPONIN, ED: TROPONIN I, POC: 0.06 ng/mL (ref 0.00–0.08)

## 2015-10-16 LAB — COMPREHENSIVE METABOLIC PANEL
ALK PHOS: 59 U/L (ref 38–126)
ALT: 28 U/L (ref 14–54)
AST: 74 U/L — ABNORMAL HIGH (ref 15–41)
Albumin: 3.6 g/dL (ref 3.5–5.0)
Anion gap: 16 — ABNORMAL HIGH (ref 5–15)
BUN: 20 mg/dL (ref 6–20)
CALCIUM: 8.9 mg/dL (ref 8.9–10.3)
CO2: 18 mmol/L — ABNORMAL LOW (ref 22–32)
CREATININE: 1.14 mg/dL — AB (ref 0.44–1.00)
Chloride: 102 mmol/L (ref 101–111)
GFR, EST AFRICAN AMERICAN: 48 mL/min — AB (ref >60)
GFR, EST NON AFRICAN AMERICAN: 41 mL/min — AB (ref >60)
Glucose, Bld: 323 mg/dL — ABNORMAL HIGH (ref 65–99)
Potassium: 3 mmol/L — ABNORMAL LOW (ref 3.5–5.1)
Sodium: 136 mmol/L (ref 135–145)
Total Bilirubin: 1.6 mg/dL — ABNORMAL HIGH (ref 0.3–1.2)
Total Protein: 7.3 g/dL (ref 6.5–8.1)

## 2015-10-16 LAB — CK: CK TOTAL: 1710 U/L — AB (ref 38–234)

## 2015-10-16 LAB — APTT: aPTT: 27 seconds (ref 24–36)

## 2015-10-16 LAB — I-STAT CG4 LACTIC ACID, ED: Lactic Acid, Venous: 2.57 mmol/L (ref 0.5–1.9)

## 2015-10-16 LAB — OCCULT BLOOD GASTRIC / DUODENUM (SPECIMEN CUP): Occult Blood, Gastric: POSITIVE — AB

## 2015-10-16 LAB — PROTIME-INR
INR: 1.17
PROTHROMBIN TIME: 15 s (ref 11.4–15.2)

## 2015-10-16 MED ORDER — SODIUM CHLORIDE 0.9 % IV BOLUS (SEPSIS)
1000.0000 mL | Freq: Once | INTRAVENOUS | Status: AC
  Administered 2015-10-16: 1000 mL via INTRAVENOUS

## 2015-10-16 MED ORDER — CLINDAMYCIN PHOSPHATE 600 MG/50ML IV SOLN
600.0000 mg | Freq: Once | INTRAVENOUS | Status: AC
  Administered 2015-10-16: 600 mg via INTRAVENOUS
  Filled 2015-10-16: qty 50

## 2015-10-16 MED ORDER — SODIUM CHLORIDE 0.9 % IV BOLUS (SEPSIS): 250.0000 mL | Freq: Once | INTRAVENOUS | Status: DC

## 2015-10-16 MED ORDER — SODIUM CHLORIDE 0.9 % IV SOLN
1000.0000 mL | INTRAVENOUS | Status: DC
  Administered 2015-10-16: 1000 mL via INTRAVENOUS

## 2015-10-16 MED ORDER — MORPHINE SULFATE 25 MG/ML IV SOLN
10.0000 mg/h | INTRAVENOUS | Status: DC
  Administered 2015-10-17: 10 mg/h via INTRAVENOUS
  Filled 2015-10-16: qty 10

## 2015-10-16 MED ORDER — ROCURONIUM BROMIDE 50 MG/5ML IV SOLN
1.0000 mg/kg | Freq: Once | INTRAVENOUS | Status: AC
  Administered 2015-10-16: 100 mg via INTRAVENOUS
  Filled 2015-10-16: qty 5

## 2015-10-16 MED ORDER — ETOMIDATE 2 MG/ML IV SOLN
0.3000 mg/kg | Freq: Once | INTRAVENOUS | Status: AC
  Administered 2015-10-16: 20 mg via INTRAVENOUS

## 2015-10-16 MED ORDER — PROPOFOL 1000 MG/100ML IV EMUL
5.0000 ug/kg/min | Freq: Once | INTRAVENOUS | Status: AC
  Administered 2015-10-16: 10 ug/kg/min via INTRAVENOUS
  Filled 2015-10-16: qty 100

## 2015-10-16 MED ORDER — PHENYLEPHRINE HCL 10 MG/ML IJ SOLN
0.0000 ug/min | INTRAVENOUS | Status: DC
  Filled 2015-10-16: qty 1

## 2015-10-16 MED ORDER — DEXTROSE 5 % IV SOLN
5.0000 mg/h | Freq: Once | INTRAVENOUS | Status: AC
  Administered 2015-10-16: 5 mg/h via INTRAVENOUS
  Filled 2015-10-16: qty 100

## 2015-10-16 MED ORDER — MORPHINE BOLUS VIA INFUSION
5.0000 mg | INTRAVENOUS | Status: DC | PRN
  Administered 2015-10-17: 5 mg via INTRAVENOUS
  Administered 2015-10-17: 10 mg via INTRAVENOUS
  Filled 2015-10-16: qty 20

## 2015-10-16 NOTE — ED Notes (Signed)
Family all at bedside; Family states they would like pt to be removed from life support once all family has had time to come see patient; Family states is patient's wishes; PA at bedside and notified

## 2015-10-16 NOTE — ED Notes (Signed)
Critical care at bedside  

## 2015-10-16 NOTE — H&P (Signed)
PULMONARY / CRITICAL CARE MEDICINE   Name: Anita Roberts MRN: [id number] DOB: [birth year]    ADMISSION DATE:  10/24/2015 CONSULTATION DATE:  10/30/2015  REFERRING MD:  EDP Dr. Tyrone Nine  CHIEF COMPLAINT:  unresponsive  HISTORY OF PRESENT ILLNESS:   82 year old female with PMH as below, which is significant for persistent atrial fibrillation on plavix, CAD wit PCI to LCX in [id number], diastolic CHF, DM, and colon Ca. She was found down at home 8/3 by neighbor with evidence of emesis and incontinence of bowel/bladder. Last known well 8/2 in the afternoon when she spoke to her daughter on the phone. Upon EMS arrival she was tachycardic in the 140s and hypoxic with O2 sats in the 80s. Upon arrival to ED she suffered another aspiration event and was admitted for airway protection. CT scan of the head demonstrated large L MCA stroke. PCCM asked to admit.   PAST MEDICAL HISTORY :  She  has a past medical history of Atrial flutter ([place]); Bradycardia; CAD (coronary artery disease); Chronic diastolic CHF (congestive heart failure) ([place]); Colon cancer ([place]); Diabetes mellitus without complication ([place]); History of GI bleed; Hypertensive heart disease; and PAF (paroxysmal atrial fibrillation) ([place]).  PAST SURGICAL HISTORY: She  has a past surgical history that includes Coronary angioplasty with stent (111/04/2013); Abdominal hysterectomy; Tonsillectomy; and left heart catheterization with coronary angiogram (N/A, 01/14/2014).  Allergies  Allergen Reactions  . Coumadin [Warfarin]     Extreme bleeding  . Shellfish Allergy     Very alert.  Unable to sleep  . Versed [Midazolam] Nausea And Vomiting  . Aspirin Rash  . Penicillins Rash  . Sulfa Antibiotics Rash    No current facility-administered medications on file prior to encounter.    Current Outpatient Prescriptions on File Prior to Encounter  Medication Sig  . citalopram (CELEXA) 20 MG tablet Take 20 mg by mouth daily.  . clopidogrel (PLAVIX) 75 MG tablet  TAKE 1 TABLET (75 MG TOTAL) BY MOUTH DAILY WITH BREAKFAST.  . furosemide (LASIX) 40 MG tablet Take 1 tablet (40 mg total) by mouth daily.  Marland Kitchen glipiZIDE (GLUCOTROL) 10 MG tablet Take 10 mg by mouth 2 (two) times daily before a meal.  . metoprolol succinate (TOPROL-XL) 50 MG 24 hr tablet Take 1 tablet (50 mg total) by mouth daily.  Marland Kitchen NITROSTAT 0.4 MG SL tablet PLACE 1 TABLET (0.4 MG TOTAL) UNDER THE TONGUE EVERY 5 (FIVE) MINUTES AS NEEDED FOR CHEST PAIN.  Marland Kitchen olmesartan (BENICAR) 40 MG tablet Take 40 mg by mouth daily.  . ranitidine (ZANTAC) 150 MG tablet Take 1 tablet by mouth 2 (two) times daily as needed.     FAMILY HISTORY:  Her has no family status information on file.    SOCIAL HISTORY: She  reports that she has never smoked. She does not have any smokeless tobacco history on file. She reports that she does not drink alcohol or use drugs.  REVIEW OF SYSTEMS:   unable  SUBJECTIVE:    VITAL SIGNS: BP 136/94 (BP Location: Left Arm)   Pulse (!) 138   Temp 98.5 F (36.9 C) (Axillary)   Resp 22   Wt 70 kg (154 lb 5.2 oz)   SpO2 96%   BMI 28.23 kg/m   HEMODYNAMICS:    VENTILATOR SETTINGS:    INTAKE / OUTPUT: No intake/output data recorded.  PHYSICAL EXAMINATION: General:  Female of normal body habitus in NAD Neuro:  Sedated, residual neuromuscular blockade HEENT:  [place]/AT, no JVD. Dried emesis  Cardiovascular:  Tachy, IRIR Lungs:  Bilateral rhonchi R>L Abdomen:  Soft, non-tender, non-distended Musculoskeletal:  No acute deformity Skin:  Grossly intact  LABS:  BMET No results for input(s): NA, K, CL, CO2, BUN, CREATININE, GLUCOSE in the last 168 hours.  Electrolytes No results for input(s): CALCIUM, MG, PHOS in the last 168 hours.  CBC No results for input(s): WBC, HGB, HCT, PLT in the last 168 hours.  Coag's No results for input(s): APTT, INR in the last 168 hours.  Sepsis Markers  Recent Labs Lab 10/29/2015 2106  LATICACIDVEN 2.57*    ABG No results  for input(s): PHART, PCO2ART, PO2ART in the last 168 hours.  Liver Enzymes No results for input(s): AST, ALT, ALKPHOS, BILITOT, ALBUMIN in the last 168 hours.  Cardiac Enzymes No results for input(s): TROPONINI, PROBNP in the last 168 hours.  Glucose No results for input(s): GLUCAP in the last 168 hours.  Imaging Dg Chest Port 1 View  Result Date: 10/22/2015 CLINICAL DATA:  82 year old found unresponsive in bed today. Vomiting episode with bilateral rhonchi. EXAM: PORTABLE CHEST 1 VIEW COMPARISON:  01/26/2012 FINDINGS: Endotracheal tube low in position 10 mm from the carina. Tip and side port of the enteric tube below the diaphragm in the stomach. The heart is enlarged with tortuosity and atherosclerosis of the thoracic aorta. There are patchy bibasilar opacities. Left pleural effusion is suspected. Possible vascular congestion. No pulmonary edema or pneumothorax. No acute osseous abnormality is seen. IMPRESSION: 1. Low position endotracheal tube 10 mm from the carina. Recommend retraction of 1-2 cm. Enteric tube in place. 2. Patchy bibasilar opacities may be atelectasis or related to aspiration. Small left pleural effusion. These results will be called to the ordering clinician or representative by the Radiologist Assistant, and communication documented in the PACS or zVision Dashboard. Electronically Signed   By: Jeb Levering M.D.   On: 10/15/2015 21:14     STUDIES:  CT head 8/3 > very large L CVA  CULTURES: bc  ANTIBIOTICS: clinda  SIGNIFICANT EVENTS:   LINES/TUBES: ett 8/3  DISCUSSION: 82 year old female who suffered large likely devastating CVA and then aspiration requiring MV. Poor prognosis. Family aware and wishing to withdrawal once all children are here which should be soon. Will support with vent, ABX until that time. Prop/ Fent for comfort.   ASSESSMENT / PLAN:  PULMONARY A: Acute hypoxemic respiratory failure secondary to aspiration PNA and failure to protect  airway.  P:   Full vent Plan to withdrawal once all family arrive ETA 1-2 hours No further ABG/CXR  CARDIOVASCULAR A:  HTN PAF  P:  Telemetry  No pressors Maintenance IVF DNR if arrests  RENAL A:   Hypokalemia  P:   No further BMP   GASTROINTESTINAL A:   Vomiting  P:   NPO Pepcid for SUP  HEMATOLOGIC A:   No acute issues  P:  No further lab draws  INFECTIOUS A:   Aspiration PNA  P:   Clinda  ENDOCRINE A:   DM  P:     NEUROLOGIC A:   Acute metabolic encephalopathy Large L MCA CVA  P:   Propofol gtt Fentanyl gtt for transition to comfort   FAMILY  - Updates: Family updated in ED by RB  - Inter-disciplinary family meet or Palliative Care meeting due by:  8/11   Georgann Housekeeper, AGACNP-BC [facility]/Critical Care Pager [phone] or [phone]  11/05/2015 10:05 PM   Attending Note:  I have examined patient, reviewed labs,  studies and notes. I have discussed the case with Jaclynn Guarneri, and I agree with the data and plans as amended above. 82 yo woman who was found unresponsive with an apparent aspiration even t at home 8/3 pm. She was last seen normal 1 day earlier by family. In the ED she had recurrent emesis and was intubated for airway protection. Head CT shows large L MCA stroke. On my evaluation she is unresponsive, hypertensive. She likely still has some sedation on board from her intubation that makes neuro eval difficult. She has been treated with clinda for aspiration PNA, with diltiazem for A fib / Flutter. We have discussed the findings with her son, grandson, granddaughter in the ED, explained that given the size of the infart she has little to no chance of a meaningful recovery to independent living. Based on this and the pt's wishes they are likely going to withdraw aggressive care this pm. They are waiting for the pt's daughter to arrive. She is DNR in event of an arrest.  Independent critical care time is 55 minutes.    Baltazar Apo, MD, PhD 11/07/2015, 10:07 PM  Pulmonary and Critical Care [phone] or if no answer [phone]

## 2015-10-16 NOTE — ED Notes (Signed)
Patient transported to CT with RN and RPT

## 2015-10-16 NOTE — ED Triage Notes (Signed)
Per EMS pt last spoke with daughter yesterday during the afternoon with normal mental status; pt is from home alone; Pt was found unresponsive by friend in the bed around 1915; Pt arousable to pain with sats in 80s RA; pt in afib at 140 HR; Pt had obvious vomiting episode; pt has rhonchi in bilateral lobe; Pt appears to have incontinent episode

## 2015-10-16 NOTE — Progress Notes (Signed)
   10/15/2015 2218  Clinical Encounter Type  Visited With Family;Health care provider  Visit Type Initial;Critical Care;ED;Patient actively dying  Referral From Nurse  Stress Factors  Family Stress Factors Loss;Health changes   Chaplain responded to a request to assist patient's family, as patient is in critical condition. Chaplain assisted in facilitating medical consultation with the family. Chaplain helped family transition to patient's room. Chaplain introduced spiritual care services. Spiritual care services available as needed.   Jeri Lager, Chaplain 11/10/2015 10:19 PM

## 2015-10-16 NOTE — ED Provider Notes (Signed)
[facility] DEPT Provider Note   CSN: [id number] Arrival date & time: 10/20/2015  2020  First Provider Contact:  First MD Initiated Contact with Patient 11/03/2015 2017        History   Chief Complaint Chief Complaint  Patient presents with  . Altered Mental Status    HPI Anita Roberts is a 82 y.o. female.   Anita Roberts is a 82 y.o. female  with a hx of NIDDM, HTN, a-fib and a-flutter, CAD, CHF (chronic diastolic), GI bleed presents to the Emergency Department via EMS after being found down at home this evening.    LEVEL 5 Caveat for AMS.  Per EMS, his daughter spoke with her yesterday and patient was without complaint.  She was found unresponsive on the floor of her home today by her daughter. Evidence of emesis and loss of bowel and bladder control.  This reports that patient has had no verbal response with them but will open her eyes to loud or painful stimuli.  Record review shows that patient is taking Plavix.      The history is provided by the EMS personnel and medical records. No language interpreter was used.  Altered Mental Status     Past Medical History:  Diagnosis Date  . Atrial flutter ([place])    a. Dx 03/2014-->Pt wishes to avoid [place] and instead remains on Plavix.  . Bradycardia   . CAD (coronary artery disease)    a. 01/2014 NSTEMI/PCI: LM 50ost, 54m/d, LAD 40p, D1 50ost/mid, 61m, LCX 70-80ost, 28m (2.75x22 Resolute DES), RCA 30p/m/d, RPDA/RPLA min irregs.  . Chronic diastolic CHF (congestive heart failure) ([place])    a. 2D ECHO 01/14/14 w/EF 50-55% and hypokinesis of the mid-apicalanterolateral and inferolateral myocardium; consistent with infarction in the distribution of the LCx coronary artery. Mild-mod AR directed centrally in LVOT, mild MR, mod-sev LA dilation, mod-sev RA dilation, PA pk pressure 56.  . Colon cancer ([place])    a. remote  . Diabetes mellitus without complication ([place])   . History of GI bleed   . Hypertensive heart disease   . PAF (paroxysmal  atrial fibrillation) ([place])    a. remotely post op colon surgery     Patient Active Problem List   Diagnosis Date Noted  . Stroke ([place]) 10/16/2015  . Atrial flutter ([place])   . Chronic diastolic CHF (congestive heart failure) ([place])   . Hypertensive heart disease   . Acute on chronic diastolic CHF (congestive heart failure) ([place]) 01/27/2014  . CAD (coronary artery disease)   . Diabetes mellitus without complication ([place])   . Hypertension   . Colon cancer ([place])   . Bradycardia   . Hypokalemia 01/16/2014  . NSTEMI (non-ST elevated myocardial infarction) ([place])   . PAF (paroxysmal atrial fibrillation) ([place])   . DM II (diabetes mellitus, type II), controlled ([place])     Past Surgical History:  Procedure Laterality Date  . ABDOMINAL HYSTERECTOMY    . CORONARY ANGIOPLASTY WITH STENT PLACEMENT  111/04/2013   LAD 40%, diagonal 50%/99% (small vessel), circumflex 99%--> 0% with 2.75 x 22 mm Resolute DES, RCA 30%  . LEFT HEART CATHETERIZATION WITH CORONARY ANGIOGRAM N/A 01/14/2014   Procedure: LEFT HEART CATHETERIZATION WITH CORONARY ANGIOGRAM;  Surgeon: Burnell Blanks, MD;  Location: [facility] CATH LAB;  Service: Cardiovascular;  Laterality: N/A;  . TONSILLECTOMY      OB History    No data available       Home Medications    Prior to Admission medications  Medication Sig Start Date End Date Taking? Authorizing Provider  citalopram (CELEXA) 20 MG tablet Take 20 mg by mouth daily.    Historical Provider, MD  clopidogrel (PLAVIX) 75 MG tablet TAKE 1 TABLET (75 MG TOTAL) BY MOUTH DAILY WITH BREAKFAST. 07/21/15   Rogelia Mire, NP  furosemide (LASIX) 40 MG tablet Take 1 tablet (40 mg total) by mouth daily. 07/15/15   Rogelia Mire, NP  glipiZIDE (GLUCOTROL) 10 MG tablet Take 10 mg by mouth 2 (two) times daily before a meal.    Historical Provider, MD  metoprolol succinate (TOPROL-XL) 50 MG 24 hr tablet Take 1 tablet (50 mg total) by mouth daily. 07/15/15   Rogelia Mire, NP    NITROSTAT 0.4 MG SL tablet PLACE 1 TABLET (0.4 MG TOTAL) UNDER THE TONGUE EVERY 5 (FIVE) MINUTES AS NEEDED FOR CHEST PAIN. 07/16/15   Evelene Croon Barrett, PA-C  olmesartan (BENICAR) 40 MG tablet Take 40 mg by mouth daily.    Historical Provider, MD  ranitidine (ZANTAC) 150 MG tablet Take 1 tablet by mouth 2 (two) times daily as needed.  03/23/14   Historical Provider, MD    Family History No family history on file.  Social History Social History  Substance Use Topics  . Smoking status: Never Smoker  . Smokeless tobacco: Not on file  . Alcohol use No     Allergies   Coumadin [warfarin]; Shellfish allergy; Versed [midazolam]; Aspirin; Penicillins; and Sulfa antibiotics   Review of Systems Review of Systems  Unable to perform ROS: Patient unresponsive     Physical Exam Updated Vital Signs BP 136/94 (BP Location: Left Arm)   Pulse (!) 138   Temp 98.5 F (36.9 C) (Axillary)   Resp 22   Wt 70 kg   SpO2 100%   BMI 28.23 kg/m   Physical Exam  Constitutional: She appears well-developed and well-nourished. She appears lethargic. She appears distressed.  Ill appearing Opens eyes to loud stimulus, but does not give a verbal response  HENT:  Head: Normocephalic and atraumatic.  Black emesis dried to the face and mouth  Eyes: Conjunctivae are normal. No scleral icterus.  Neck: Neck supple.  Cardiovascular: Regular rhythm and intact distal pulses.  Tachycardia present.   Pulses:      Radial pulses are 2+ on the right side, and 2+ on the left side.       Dorsalis pedis pulses are 1+ on the right side, and 1+ on the left side.  Pulmonary/Chest: Accessory muscle usage present. She is in respiratory distress. She has rhonchi. She has rales.  Equal chest expansion Rhonchi and rales throughout Mild accessory muscle usage throughout  Abdominal: Soft. Bowel sounds are normal. She exhibits no mass. There is no tenderness. There is no rebound and no guarding.  Musculoskeletal: Normal range  of motion. She exhibits no edema.  Neurological: She appears lethargic. GCS eye subscore is 3. GCS verbal subscore is 1. GCS motor subscore is 1.  No purposeful movements  Skin: Skin is warm and dry. She is not diaphoretic. There is pallor.  Nursing note and vitals reviewed.    ED Treatments / Results  Labs (all labs ordered are listed, but only abnormal results are displayed) Labs Reviewed  COMPREHENSIVE METABOLIC PANEL - Abnormal; Notable for the following:       Result Value   Potassium 3.0 (*)    CO2 18 (*)    Glucose, Bld 323 (*)    Creatinine, Ser 1.14 (*)  AST 74 (*)    Total Bilirubin 1.6 (*)    GFR calc non Af Amer 41 (*)    GFR calc Af Amer 48 (*)    Anion gap 16 (*)    All other components within normal limits  CBC WITH DIFFERENTIAL/PLATELET - Abnormal; Notable for the following:    WBC 14.1 (*)    RBC 5.18 (*)    Hemoglobin 15.5 (*)    Neutro Abs 11.5 (*)    Monocytes Absolute 1.4 (*)    All other components within normal limits  URINALYSIS, ROUTINE W REFLEX MICROSCOPIC (NOT AT [facility]) - Abnormal; Notable for the following:    Glucose, UA >1000 (*)    Hgb urine dipstick LARGE (*)    Ketones, ur >80 (*)    Protein, ur 100 (*)    All other components within normal limits  OCCULT BLOOD GASTRIC / DUODENUM (SPECIMEN CUP) - Abnormal; Notable for the following:    Occult Blood, Gastric POSITIVE (*)    All other components within normal limits  CK - Abnormal; Notable for the following:    Total CK 1,710 (*)    All other components within normal limits  URINE MICROSCOPIC-ADD ON - Abnormal; Notable for the following:    Squamous Epithelial / LPF 0-5 (*)    Bacteria, UA RARE (*)    Casts GRANULAR CAST (*)    All other components within normal limits  I-STAT CG4 LACTIC ACID, ED - Abnormal; Notable for the following:    Lactic Acid, Venous 2.57 (*)    All other components within normal limits  I-STAT ARTERIAL BLOOD GAS, ED - Abnormal; Notable for the following:     pH, Arterial 7.261 (*)    pO2, Arterial 496.0 (*)    Bicarbonate 18.7 (*)    Acid-base deficit 8.0 (*)    All other components within normal limits  CULTURE, BLOOD (ROUTINE X 2)  CULTURE, BLOOD (ROUTINE X 2)  URINE CULTURE  PROTIME-INR  APTT  BRAIN NATRIURETIC PEPTIDE  BLOOD GAS, ARTERIAL  I-STAT TROPOININ, ED  I-STAT CG4 LACTIC ACID, ED    EKG  EKG Interpretation None       Radiology Ct Head Wo Contrast  Result Date: 10/15/2015 CLINICAL DATA:  82 year old female found down. EXAM: CT HEAD WITHOUT CONTRAST CT CERVICAL SPINE WITHOUT CONTRAST TECHNIQUE: Multidetector CT imaging of the head and cervical spine was performed following the standard protocol without intravenous contrast. Multiplanar CT image reconstructions of the cervical spine were also generated. COMPARISON:  None. FINDINGS: CT HEAD FINDINGS Large area of diminished density involving the subcortical white matter of the left cerebral hemisphere in the MCA distribution consistent with subacute ischemia. No internal hemorrhage. There is minimal midline shift in the frontal lobe of 4 mm with effacement of the frontal horn of the left lateral ventricle. No subdural or intracranial fluid collection. Atherosclerosis of skullbase vasculature. No definite hyperdense MCA. The basilar cisterns are patent. There are no acute or suspicious osseous abnormalities. CT CERVICAL SPINE FINDINGS No fracture or acute subluxation. The dens is intact. There are no jumped or perched facets. Vertebral body heights are maintained. Disc space narrowing and endplate spurring is most prominent at C5-C6. Non fusion posterior elements of C1. There is a probable vertebral body hemangioma within T2, partially included. No prevertebral soft tissue edema. Atherosclerotic plaque at the carotid bulbs. IMPRESSION: 1. Large subacute infarct in the left MCA territory with frontal midline shift of 4 mm and effacement of the frontal  horn of the left lateral ventricle. No  internal hemorrhage. 2. No acute abnormality in the cervical spine. Degenerative changes mild for age. These results were called by telephone at the time of interpretation on 11/12/2015 at 9:38 pm to [place] , who verbally acknowledged these results. Electronically Signed   By: Jeb Levering M.D.   On: 10/28/2015 21:41   Ct Cervical Spine Wo Contrast  Result Date: 10/19/2015 CLINICAL DATA:  82 year old female found down. EXAM: CT HEAD WITHOUT CONTRAST CT CERVICAL SPINE WITHOUT CONTRAST TECHNIQUE: Multidetector CT imaging of the head and cervical spine was performed following the standard protocol without intravenous contrast. Multiplanar CT image reconstructions of the cervical spine were also generated. COMPARISON:  None. FINDINGS: CT HEAD FINDINGS Large area of diminished density involving the subcortical white matter of the left cerebral hemisphere in the MCA distribution consistent with subacute ischemia. No internal hemorrhage. There is minimal midline shift in the frontal lobe of 4 mm with effacement of the frontal horn of the left lateral ventricle. No subdural or intracranial fluid collection. Atherosclerosis of skullbase vasculature. No definite hyperdense MCA. The basilar cisterns are patent. There are no acute or suspicious osseous abnormalities. CT CERVICAL SPINE FINDINGS No fracture or acute subluxation. The dens is intact. There are no jumped or perched facets. Vertebral body heights are maintained. Disc space narrowing and endplate spurring is most prominent at C5-C6. Non fusion posterior elements of C1. There is a probable vertebral body hemangioma within T2, partially included. No prevertebral soft tissue edema. Atherosclerotic plaque at the carotid bulbs. IMPRESSION: 1. Large subacute infarct in the left MCA territory with frontal midline shift of 4 mm and effacement of the frontal horn of the left lateral ventricle. No internal hemorrhage. 2. No acute abnormality in the cervical  spine. Degenerative changes mild for age. These results were called by telephone at the time of interpretation on 11/05/2015 at 9:38 pm to [place] , who verbally acknowledged these results. Electronically Signed   By: Jeb Levering M.D.   On: 11/07/2015 21:41   Dg Chest Port 1 View  Result Date: 10/27/2015 CLINICAL DATA:  82 year old found unresponsive in bed today. Vomiting episode with bilateral rhonchi. EXAM: PORTABLE CHEST 1 VIEW COMPARISON:  01/26/2012 FINDINGS: Endotracheal tube low in position 10 mm from the carina. Tip and side port of the enteric tube below the diaphragm in the stomach. The heart is enlarged with tortuosity and atherosclerosis of the thoracic aorta. There are patchy bibasilar opacities. Left pleural effusion is suspected. Possible vascular congestion. No pulmonary edema or pneumothorax. No acute osseous abnormality is seen. IMPRESSION: 1. Low position endotracheal tube 10 mm from the carina. Recommend retraction of 1-2 cm. Enteric tube in place. 2. Patchy bibasilar opacities may be atelectasis or related to aspiration. Small left pleural effusion. These results will be called to the ordering clinician or representative by the Radiologist Assistant, and communication documented in the PACS or zVision Dashboard. Electronically Signed   By: Jeb Levering M.D.   On: 10/21/2015 21:14    Procedures Glidescope laryngoscopy Date/Time: 10/19/2015 9:19 PM Performed by: Abigail Butts Authorized by: Abigail Butts  Consent: The procedure was performed in an emergent situation. Test results: test results available and properly labeled Required items: required blood products, implants, devices, and special equipment available Patient identity confirmed: arm band Time out: Immediately prior to procedure a "time out" was called to verify the correct patient, procedure, equipment, support staff and site/side marked as required. Preparation: Patient was  prepped  and draped in the usual sterile fashion. Local anesthesia used: no  Anesthesia: Local anesthesia used: no  Sedation: Patient sedated: yes Sedatives: see MAR for details Sedation start date/time: 11/02/2015 8:50 PM Vitals: Vital signs were monitored during sedation. Patient tolerance of procedure: post procedure hypotension; pt given fluids with improvement. Comments:  INTUBATION Performed by: Abigail Butts  Required items: required blood products, implants, devices, and special equipment available Patient identity confirmed: provided demographic data and hospital-assigned identification number Time out: Immediately prior to procedure a "time out" was called to verify the correct patient, procedure, equipment, support staff and site/side marked as required.  Indications: AMS, unable to protect airway  Intubation method: Glidescope Laryngoscopy   Preoxygenation: BVM and [place] at 10LMP  Sedatives: 20mg  Etomidate Paralytic: 100mg  rocuronium  Tube Size: 7.5 cuffed  Post-procedure assessment: chest rise and ETCO2 monitor Breath sounds: equal and absent over the epigastrium Tube secured with: ETT holder Chest x-ray interpreted by radiologist and me.  Chest x-ray findings: endotracheal tube in appropriate position  Patient with hypotension post procedure, improved with fluids      (including critical care time)  CRITICAL CARE Performed by: Abigail Butts Total critical care time: 65 minutes Critical care time was exclusive of separately billable procedures and treating other patients. Critical care was necessary to treat or prevent imminent or life-threatening deterioration. Critical care was time spent personally by me on the following activities: development of treatment plan with patient and/or surrogate as well as nursing, discussions with consultants, evaluation of patient's response to treatment, examination of patient, obtaining history from patient or  surrogate, ordering and performing treatments and interventions, ordering and review of laboratory studies, ordering and review of radiographic studies, pulse oximetry and re-evaluation of patient's condition.   Medications Ordered in ED Medications  0.9 %  sodium chloride infusion (1,000 mLs Intravenous New Bag/Given 11/02/2015 2050)  phenylephrine (NEO-SYNEPHRINE) 10 mg in dextrose 5 % 250 mL (0.04 mg/mL) infusion (0 mcg/min Intravenous Stopped 11/09/2015 2333)  sodium chloride 0.9 % bolus 1,000 mL (0 mLs Intravenous Stopped 11/02/2015 2143)    And  sodium chloride 0.9 % bolus 1,000 mL (1,000 mLs Intravenous Transfusing/Transfer 10/26/2015 2301)    And  sodium chloride 0.9 % bolus 250 mL (250 mLs Intravenous Not Given 11/12/2015 2215)  diltiazem (CARDIZEM) 100 mg in dextrose 5 % 100 mL (1 mg/mL) infusion (0 mg/hr Intravenous Stopped 11/09/2015 2332)  rocuronium (ZEMURON) injection 1 mg/kg (100 mg Intravenous Given 10/31/2015 2052)  etomidate (AMIDATE) injection 0.3 mg/kg (20 mg Intravenous Given 10/22/2015 2051)  sodium chloride 0.9 % bolus 1,000 mL (0 mLs Intravenous Stopped 10/24/2015 2209)  clindamycin (CLEOCIN) IVPB 600 mg (0 mg Intravenous Stopped 11/03/2015 2301)  propofol (DIPRIVAN) 1000 MG/100ML infusion (10 mcg/kg/min  70 kg Intravenous Transfusing/Transfer 10/14/2015 2301)     Initial Impression / Assessment and Plan / ED Course  I have reviewed the triage vital signs and the nursing notes.  Pertinent labs & imaging results that were available during my care of the patient were reviewed by me and considered in my medical decision making (see chart for details).  Clinical Course  Value Comment By Time  Pulse Rate: (!) 138 tachycardia Abigail Butts, PA-C 08/03 2027  SpO2: 96 % On NRB at 15lpm [facility], PA-C 08/03 2027  ED EKG 12-Lead A-flutter, tachycardic and regular appearing.  Hx of a-fib. Abigail Butts, PA-C 08/03 2027   Family had arrived.  Pt's son reports that he attempted to make contact  with  her this AM around 9:30, but she was unreachable.  EMS was contacted this evening and found pt unresponsive in her bed.   Jarrett Soho Jearlene Bridwell, PA-C 08/03 2100  Lactic Acid, Venous: (!!) 2.57 High.  30cc/kg bolus initiated. Jarrett Soho Marie Borowski, PA-C 08/03 2135  WBC: (!) 14.1 Leukocytosis Abigail Butts, PA-C 08/03 2135  DG Chest Port 1 View Bibasilar opacities; likely aspiration pneumonia. Will give clindamycin as patient has penicillin allergy. ET tube will be pulled back 2cm.   Abigail Butts, PA-C 08/03 2135   Critical Care has evaluated the patient and will admit Abigail Butts, PA-C 08/03 2151  CT Head Wo Contrast Large subacute infarct in the left MCA territory with frontal midline shift of 4 mm and effacement of the frontal horn of the left lateral ventricle. No internal hemorrhage.  Likely the cause of pt's AMS.  Will consult neurology.  Family updated. Abigail Butts, PA-C 08/03 2152   Critical care discussed prognosis with family who has decided to withdraw care at this time.   Jarrett Soho Maudine Kluesner, PA-C 08/03 2156  Potassium: (!) 3.0 Hypokalemia Abigail Butts, PA-C 08/03 2247  Pulse Rate: (!) 130 Pt remains tachycardic Abigail Butts, PA-C 08/03 2248   Family at bedside.  Questions answered.  Jarrett Soho Charma Mocarski, PA-C 08/03 2300  Occult Blood, Gastric: (!) POSITIVE Positive Abigail Butts, PA-C 08/03 2335  Nitrite: NEGATIVE No evidence of UTI Abigail Butts, PA-C 08/03 2336    Pt arrives with AMS, not protecting her airway.  Pt intubated in ED.  A-flutter on ECG, Cardizem initiated.  CT with large left MCA infarct.  Pt admitted by critical care.  Family wishes to withdraw care at this time due to grave prognosis.    The patient was discussed with and seen by Dr. Tyrone Nine who agrees with the treatment plan.   Final Clinical Impressions(s) / ED Diagnoses   Final diagnoses:  Altered mental status, unspecified altered mental status type  Acute  ischemic left MCA stroke (HCC)  Respiratory distress  Elevated lactic acid level  Atrial flutter, unspecified type [facility])    New Prescriptions Current Discharge Medication List       Abigail Butts, PA-C 10/29/2015 [place], DO 11-12-15 [id number]

## 2015-10-17 LAB — GLUCOSE, CAPILLARY: GLUCOSE-CAPILLARY: 317 mg/dL — AB (ref 65–99)

## 2015-10-18 LAB — URINE CULTURE

## 2015-10-20 ENCOUNTER — Telehealth: Payer: Self-pay

## 2015-10-20 NOTE — Telephone Encounter (Signed)
On Nov 03, 2015, I received a death certificate from [place] (Method:Burial). The death certificate will be dropped off @ the Pulmonary Unit ([facility] - A side) for Dr. Baltazar Apo to sign on 10/21/2015. 10/20/15 ab

## 2015-10-21 ENCOUNTER — Telehealth: Payer: Self-pay

## 2015-10-21 LAB — CULTURE, BLOOD (ROUTINE X 2)

## 2015-10-21 NOTE — Telephone Encounter (Signed)
On 10/21/2015 I received the death certificate back from Doctor Byrum. I got the death certificate ready and called the funeral home to let them know the death certificate is ready for pickup.

## 2015-11-14 NOTE — ED Provider Notes (Signed)
ED ECG REPORT   Date: 10/22/2015  Rate: 138  Rhythm: atrial flutter  QRS Axis: right  Intervals: normal  ST/T Wave abnormalities: nonspecific ST/T changes  Conduction Disutrbances:incomplete right bundle branch block  Narrative Interpretation: a-flutter; ST changes likely rate related;  Old EKG Reviewed: largely unchanged aside from rate  I have personally reviewed the EKG tracing and agree with the computerized printout as noted.    Jarrett Soho Drayven Marchena, PA-C 13-Nov-2015 [place], DO 10/19/15 [phone]

## 2015-11-14 NOTE — Progress Notes (Signed)
Pt on morphine drip on comfort care. Daughter at bedside. Pt began to brady and went asystole. No breath sounds or heartbeat auscultated. Girard Cooter RN verified. CCM elink made aware. Time of death 80. [facility] called.   150 ml morphine wasted in sink. Estella Husk RN witnessed.

## 2015-11-14 NOTE — Procedures (Signed)
Extubation Procedure Note  Patient Details:   Name: Anita Roberts DOB: 14-Nov-1923 MRN: [id number]   Airway Documentation:     Evaluation  O2 sats: stable throughout Complications: No apparent complications Patient did tolerate procedure well. Bilateral Breath Sounds: Rhonchi   Yes   Pt terminally extubated to 4L [place] without complications.  Weldon Inches 11-09-15, 1:19 AM

## 2015-11-14 DEATH — deceased

## 2015-12-14 NOTE — Discharge Summary (Signed)
PULMONARY / CRITICAL CARE MEDICINE DEATH SUMMARY   Name: Anita Roberts MRN: [id number] DOB: 1923-11-13    ADMISSION DATE:  10-23-2015 CONSULTATION DATE:  Oct 23, 2015 DATE OF DEATH: October 24, 2015  REFERRING MD:  EDP Dr. Tyrone Nine  FINAL CAUSE OF DEATH Left MCA stroke  SECONDARY CAUSES OF DEATH Acute encephalopathy Acute respiratory failure with hypoxemia Aspiration pneumonia Atrial fibrillation /Flutter Coronary artery disease Hypertension Chronic diastolic CHF Diabetes mellitus Hypokalemia History of colon cancer   Brief hospital course:   82 year old female with PMH significant for persistent atrial fibrillation on plavix, CAD wit PCI to LCX in [id number], diastolic CHF, DM, and colon Ca. She was found down at home 2022/10/23 by neighbor with evidence of emesis and incontinence of bowel/bladder. Last known well 8/2 in the afternoon when she spoke to her daughter on the phone. Upon EMS arrival she was tachycardic in the 140s and hypoxic with O2 sats in the 80s. Upon arrival to ED she suffered another aspiration event and was Intubated for airway protection. CT scan of the head demonstrated large L MCA stroke. Her family was made aware of the severity of her stroke, poor prognosis for meaningful survival. Based on her wishes decision was made to withdraw mechanical ventilation and to focus on comfort care when the rest of her family arrived to be with her. She was extubated 10/24/2015 and expired on that same day with family present.   PAST MEDICAL HISTORY :  She  has a past medical history of Atrial flutter ([place]); Bradycardia; CAD (coronary artery disease); Chronic diastolic CHF (congestive heart failure) ([place]); Colon cancer ([place]); Diabetes mellitus without complication ([place]); History of GI bleed; Hypertensive heart disease; and PAF (paroxysmal atrial fibrillation) ([place]).  PAST SURGICAL HISTORY: She  has a past surgical history that includes Coronary angioplasty with stent (111/04/2013); Abdominal hysterectomy;  Tonsillectomy; and left heart catheterization with coronary angiogram (N/A, 01/14/2014).  Allergies  Allergen Reactions  . Coumadin [Warfarin]     Extreme bleeding  . Shellfish Allergy     Very alert.  Unable to sleep  . Versed [Midazolam] Nausea And Vomiting  . Aspirin Rash  . Penicillins Rash  . Sulfa Antibiotics Rash    No current facility-administered medications on file prior to encounter.    Current Outpatient Prescriptions on File Prior to Encounter  Medication Sig  . citalopram (CELEXA) 20 MG tablet Take 20 mg by mouth daily.  . clopidogrel (PLAVIX) 75 MG tablet TAKE 1 TABLET (75 MG TOTAL) BY MOUTH DAILY WITH BREAKFAST.  . furosemide (LASIX) 40 MG tablet Take 1 tablet (40 mg total) by mouth daily.  Marland Kitchen glipiZIDE (GLUCOTROL) 10 MG tablet Take 10 mg by mouth 2 (two) times daily before a meal.  . metoprolol succinate (TOPROL-XL) 50 MG 24 hr tablet Take 1 tablet (50 mg total) by mouth daily.  Marland Kitchen NITROSTAT 0.4 MG SL tablet PLACE 1 TABLET (0.4 MG TOTAL) UNDER THE TONGUE EVERY 5 (FIVE) MINUTES AS NEEDED FOR CHEST PAIN.  Marland Kitchen olmesartan (BENICAR) 40 MG tablet Take 40 mg by mouth daily.  . ranitidine (ZANTAC) 150 MG tablet Take 1 tablet by mouth 2 (two) times daily as needed.     FAMILY HISTORY:  Her has no family status information on file.    SOCIAL HISTORY: She  reports that she has never smoked. She does not have any smokeless tobacco history on file. She reports that she does not drink alcohol or use drugs.   Baltazar Apo, MD, PhD 11/17/2015, 4:04 PM [facility] [phone] or if no answer [phone]

## 2018-02-17 IMAGING — CR DG CHEST 1V PORT
1 series · 1 of 1 positions shown · non-contrast
Comparison: 01/26/2012

CLINICAL DATA: [AGE] found unresponsive in bed today.
Vomiting episode with bilateral rhonchi.

EXAM:
PORTABLE CHEST 1 VIEW

[AP]
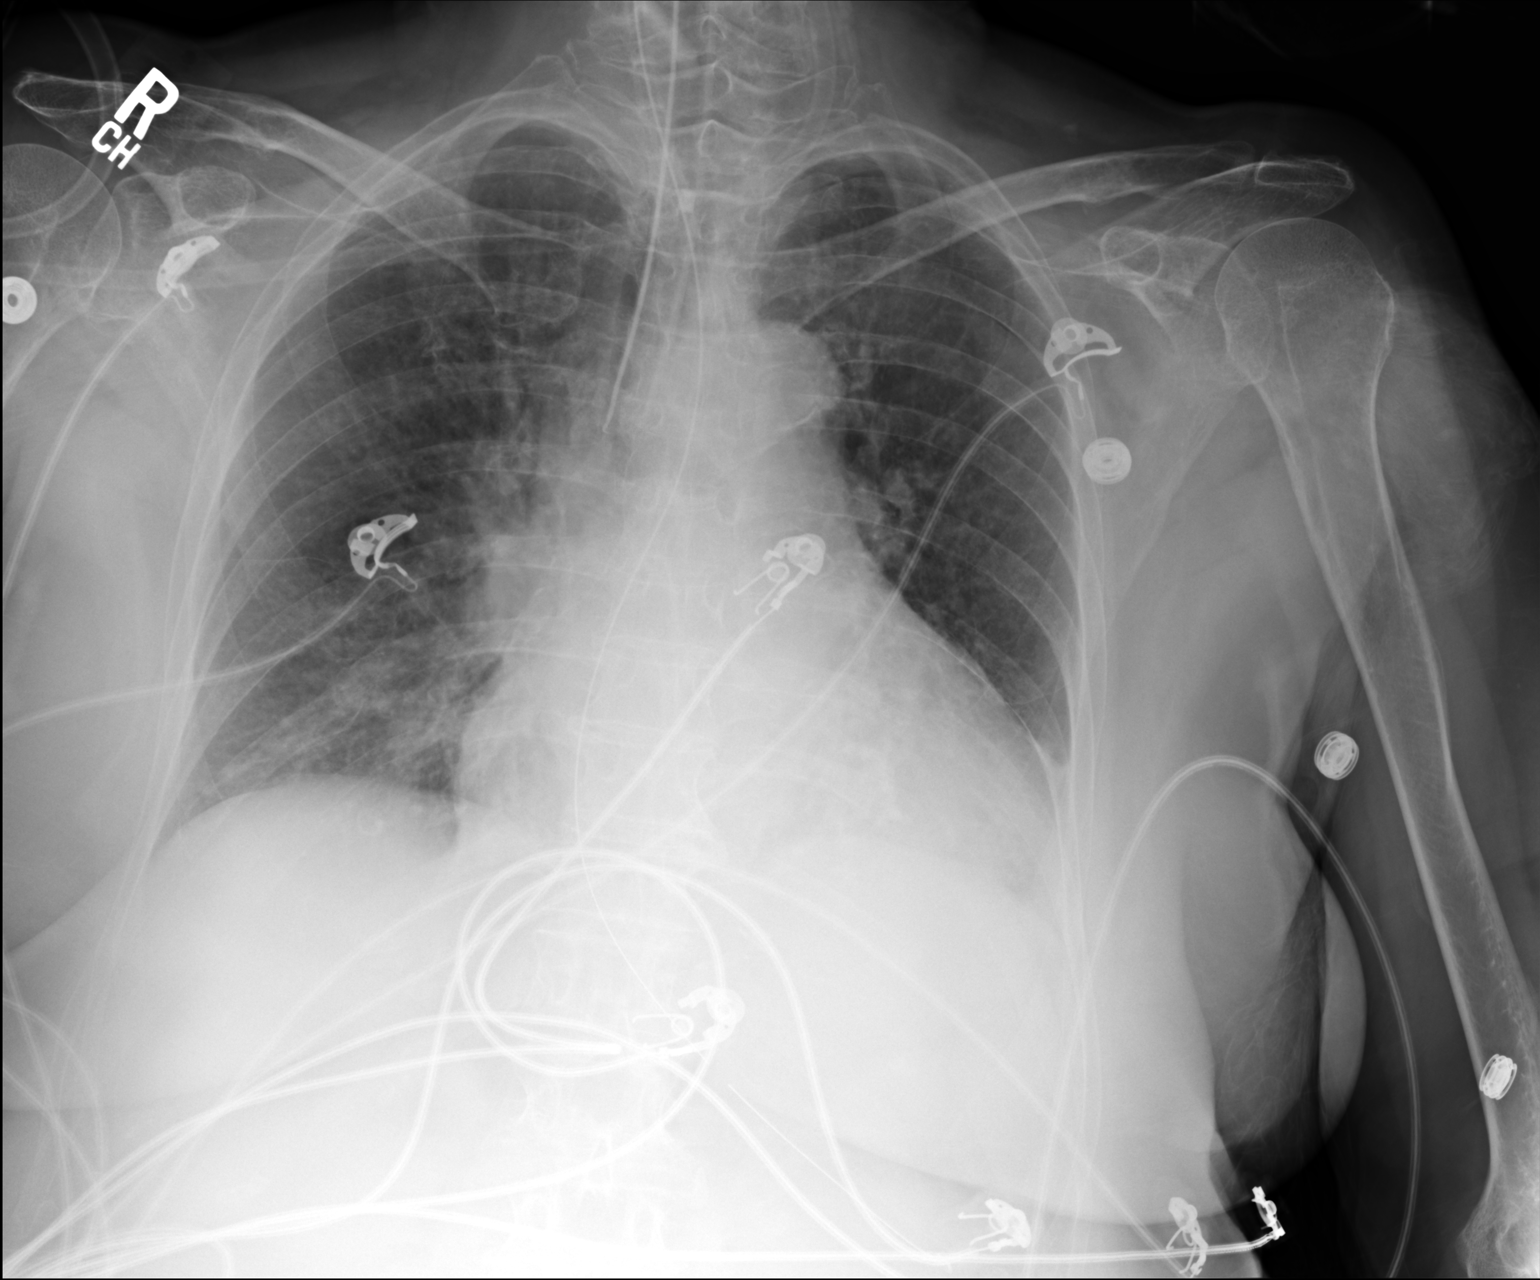

[1 of 1 positions shown; findings below may reference images not displayed]

FINDINGS: Endotracheal tube low in position 10 mm from the carina. Tip and
side port of the enteric tube below the diaphragm in the stomach.
The heart is enlarged with tortuosity and atherosclerosis of the
thoracic aorta. There are patchy bibasilar opacities. Left pleural
effusion is suspected. Possible vascular congestion. No pulmonary
edema or pneumothorax. No acute osseous abnormality is seen.
IMPRESSION: 1. Low position endotracheal tube 10 mm from the carina. Recommend
retraction of 1-2 cm. Enteric tube in place.
2. Patchy bibasilar opacities may be atelectasis or related to
aspiration. Small left pleural effusion.
These results will be called to the ordering clinician or
representative by the Radiologist Assistant, and communication
documented in the PACS or zVision Dashboard.

## 2018-02-17 IMAGING — CT CT HEAD W/O CM
4 of 7 series · 16 of 47 positions shown, 17 images · non-contrast
Comparison: None.

CLINICAL DATA: [AGE] female found down.

EXAM:
CT HEAD WITHOUT CONTRAST
CT CERVICAL SPINE WITHOUT CONTRAST
TECHNIQUE: Multidetector CT imaging of the head and cervical spine was
performed following the standard protocol without intravenous
contrast. Multiplanar CT image reconstructions of the cervical spine
were also generated.

[Series 3: head without · axial · non-contrast · 0.40mm/px · z∈[-111,-31]mm · 3 of 33 slices shown, 4 images]
[im 9/33  brain]
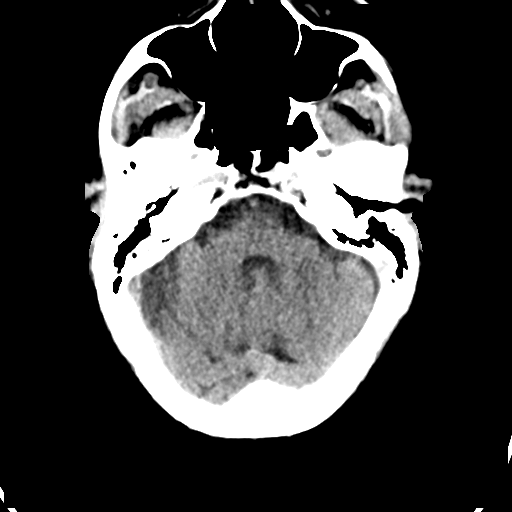
[im 9/33  bone]
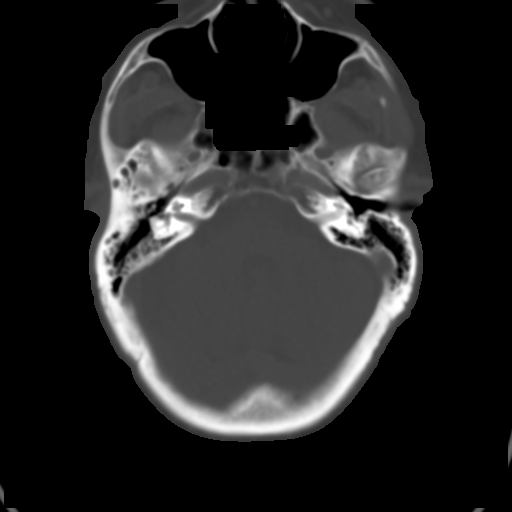
[im 17/33  brain]
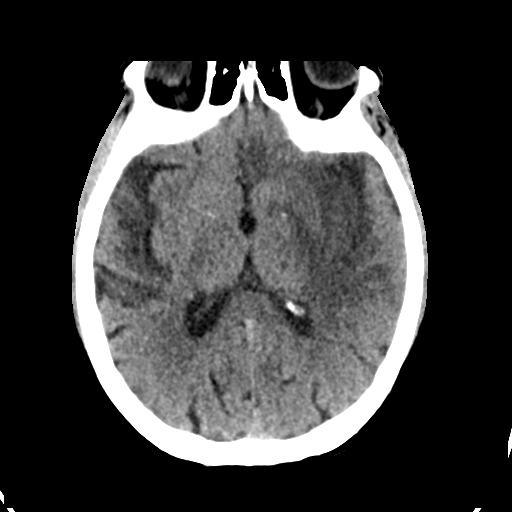
[im 25/33  brain]
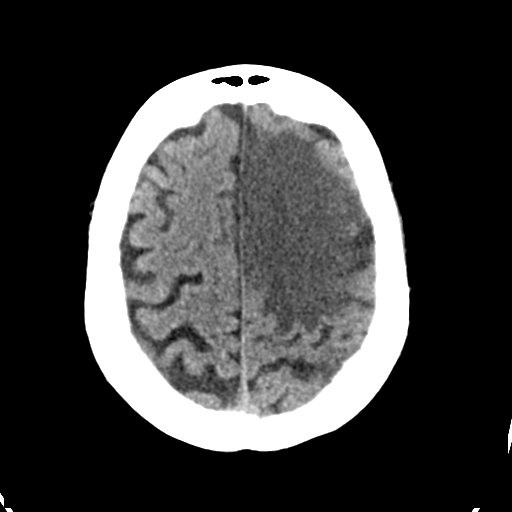

[Series 4: head bone · axial · 0.40mm/px · z∈[-139,-1]mm · 8 of 83 slices shown]
[im 7/83  bone]
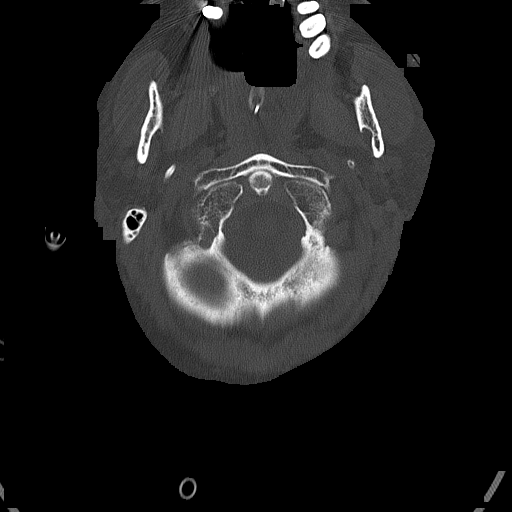
[im 21/83  bone]
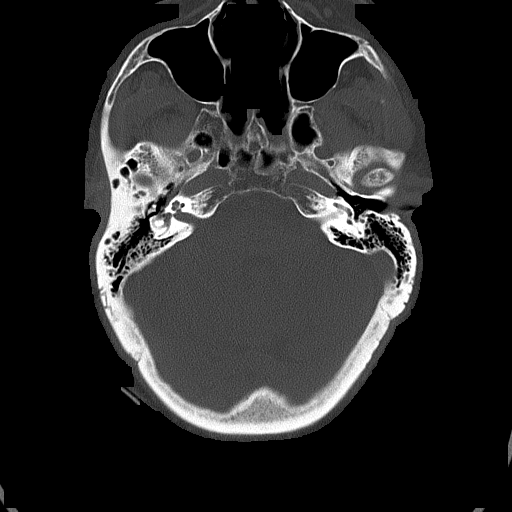
[im 28/83  bone]
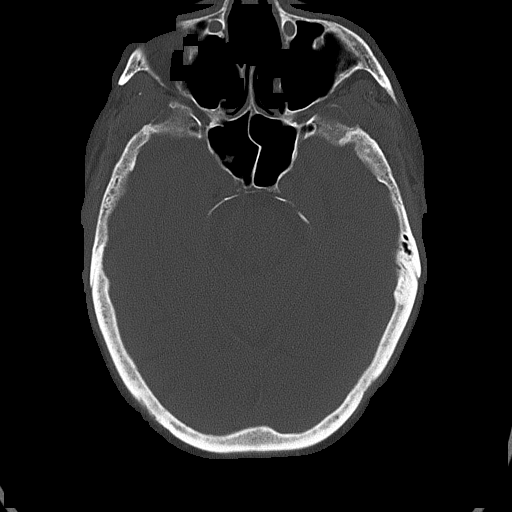
[im 35/83  bone]
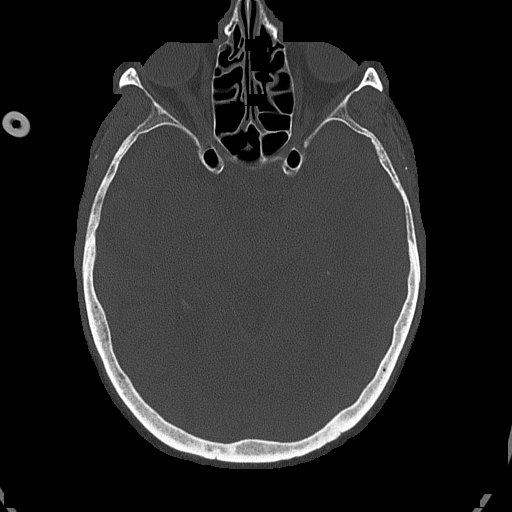
[im 48/83  bone]
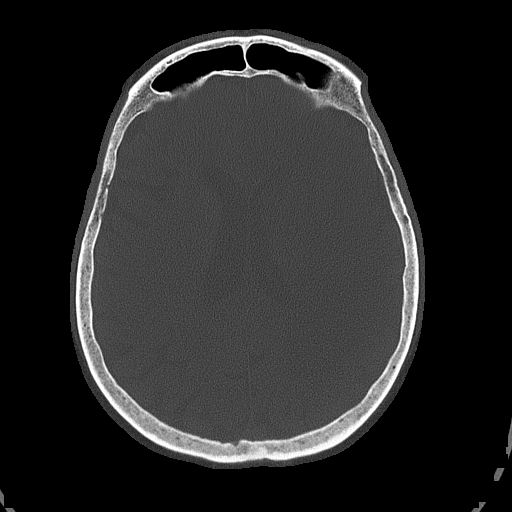
[im 55/83  bone]
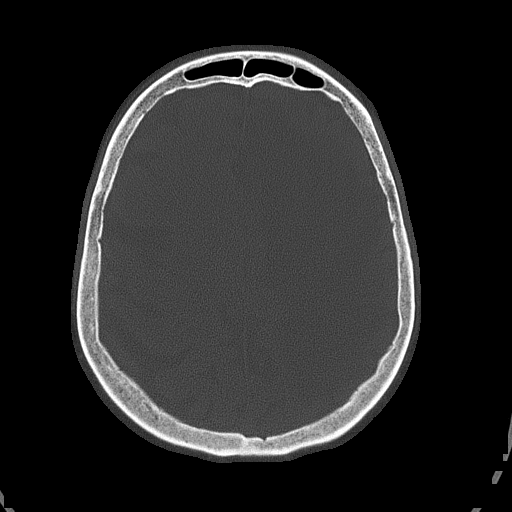
[im 62/83  bone]
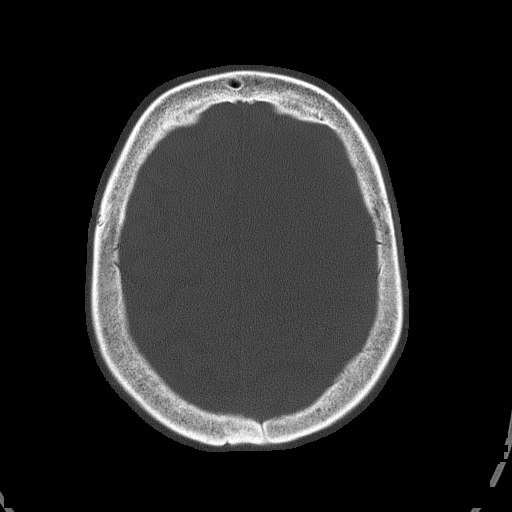
[im 76/83  bone]
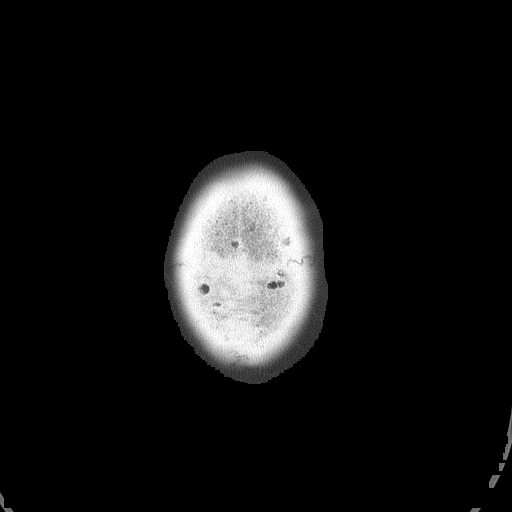

[Series 5: head without cor · coronal · non-contrast · 0.34mm/px · 3 of 66 slices shown]
[im 17/66  brain]
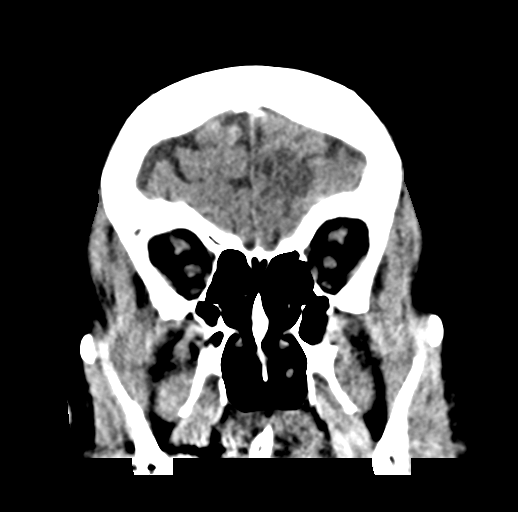
[im 33/66  brain]
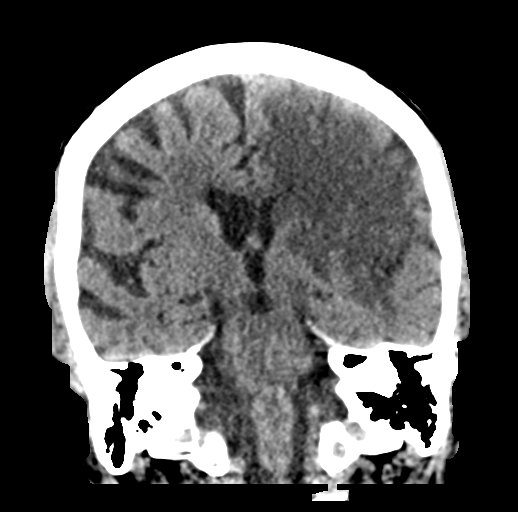
[im 49/66  brain]
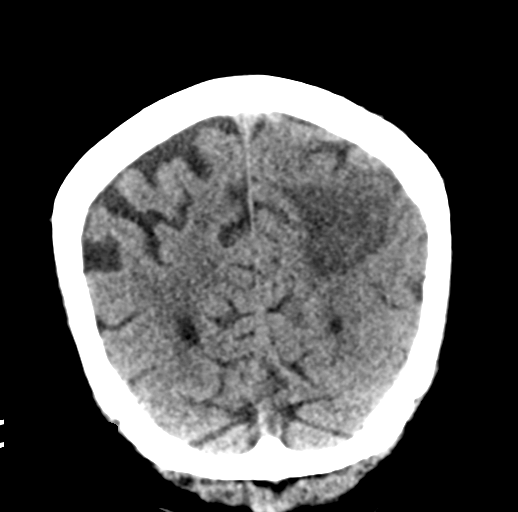

[Series 6: head without sag · sagittal · non-contrast · 0.33mm/px · 2 of 62 slices shown]
[im 21/62  brain]
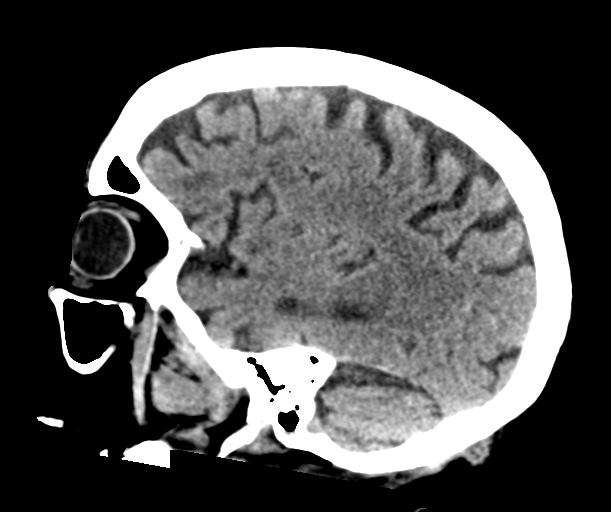
[im 41/62  brain]
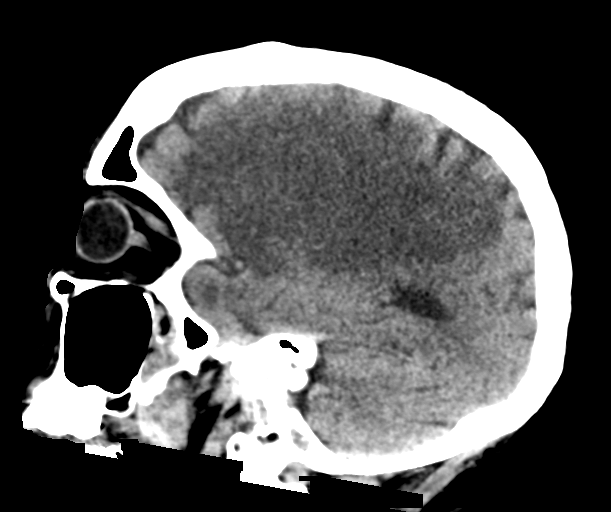

[16 of 47 positions shown; findings below may reference images not displayed]

FINDINGS: CT HEAD FINDINGS

Large area of diminished density involving the subcortical white
matter of the left cerebral hemisphere in the MCA distribution
consistent with subacute ischemia. No internal hemorrhage. There is
minimal midline shift in the frontal lobe of 4 mm with effacement of
the frontal horn of the left lateral ventricle. No subdural or
intracranial fluid collection. Atherosclerosis of skullbase
vasculature. No definite hyperdense MCA. The basilar cisterns are
patent. There are no acute or suspicious osseous abnormalities.

CT CERVICAL SPINE FINDINGS

No fracture or acute subluxation. The dens is intact. There are no
jumped or perched facets. Vertebral body heights are maintained.
Disc space narrowing and endplate spurring is most prominent at
C5-C6. Non fusion posterior elements of C1. There is a probable
vertebral body hemangioma within T2, partially included. No
prevertebral soft tissue edema. Atherosclerotic plaque at the
carotid bulbs.
IMPRESSION: 1. Large subacute infarct in the left MCA territory with frontal
midline shift of 4 mm and effacement of the frontal horn of the left
lateral ventricle. No internal hemorrhage.
2. No acute abnormality in the cervical spine. Degenerative changes
mild for age.

These results were called by telephone at the time of interpretation
on 10/16/2015 at [DATE] to PA OURARI TIGER , who verbally
acknowledged these results.
